# Patient Record
Sex: Female | Born: 1980 | Hispanic: Yes | Marital: Single | Smoking: Never smoker
Health system: Southern US, Community
[De-identification: ages and names within clinical notes are randomized; demographics above are authoritative.]

## PROBLEM LIST (undated history)

## (undated) ENCOUNTER — Inpatient Hospital Stay (HOSPITAL_COMMUNITY): Payer: Self-pay

## (undated) DIAGNOSIS — Z789 Other specified health status: Secondary | ICD-10-CM

## (undated) HISTORY — PX: NO PAST SURGERIES: SHX2092

---

## 2006-02-12 ENCOUNTER — Ambulatory Visit: Payer: Self-pay | Admitting: Obstetrics & Gynecology

## 2006-02-19 ENCOUNTER — Ambulatory Visit: Payer: Self-pay | Admitting: Obstetrics & Gynecology

## 2006-02-22 ENCOUNTER — Ambulatory Visit: Payer: Self-pay | Admitting: Obstetrics & Gynecology

## 2006-02-22 ENCOUNTER — Inpatient Hospital Stay (HOSPITAL_COMMUNITY): Admission: AD | Admit: 2006-02-22 | Discharge: 2006-02-25 | Payer: Self-pay | Admitting: Obstetrics & Gynecology

## 2008-11-10 ENCOUNTER — Emergency Department (HOSPITAL_COMMUNITY): Admission: EM | Admit: 2008-11-10 | Discharge: 2008-11-10 | Payer: Self-pay | Admitting: Emergency Medicine

## 2009-05-01 LAB — SICKLE CELL SCREEN: SICKLE CELL SCREEN: NORMAL

## 2009-06-06 ENCOUNTER — Encounter: Payer: Self-pay | Admitting: Emergency Medicine

## 2009-06-06 ENCOUNTER — Inpatient Hospital Stay (HOSPITAL_COMMUNITY): Admission: AD | Admit: 2009-06-06 | Discharge: 2009-06-06 | Payer: Self-pay | Admitting: Obstetrics & Gynecology

## 2009-06-06 ENCOUNTER — Ambulatory Visit: Payer: Self-pay | Admitting: Obstetrics and Gynecology

## 2010-08-12 LAB — URINALYSIS, ROUTINE W REFLEX MICROSCOPIC
Bilirubin Urine: NEGATIVE
Ketones, ur: NEGATIVE mg/dL
Nitrite: NEGATIVE
Urobilinogen, UA: 0.2 mg/dL (ref 0.0–1.0)
pH: 6 (ref 5.0–8.0)

## 2010-08-12 LAB — CBC
HCT: 37.6 % (ref 36.0–46.0)
Hemoglobin: 13.1 g/dL (ref 12.0–15.0)
WBC: 7.9 10*3/uL (ref 4.0–10.5)

## 2010-08-12 LAB — COMPREHENSIVE METABOLIC PANEL
Alkaline Phosphatase: 52 U/L (ref 39–117)
BUN: 4 mg/dL — ABNORMAL LOW (ref 6–23)
Chloride: 105 mEq/L (ref 96–112)
GFR calc non Af Amer: >60 mL/min (ref >60)
Glucose, Bld: 101 mg/dL — ABNORMAL HIGH (ref 70–99)
Potassium: 3.9 mEq/L (ref 3.5–5.1)
Total Bilirubin: 0.4 mg/dL (ref 0.3–1.2)

## 2010-08-12 LAB — DIFFERENTIAL
Basophils Absolute: 0.1 10*3/uL (ref 0.0–0.1)
Basophils Relative: 1 % (ref 0–1)
Neutro Abs: 4.9 10*3/uL (ref 1.7–7.7)
Neutrophils Relative %: 62 % (ref 43–77)

## 2010-08-12 LAB — URINE MICROSCOPIC-ADD ON

## 2010-08-12 LAB — RPR: RPR Ser Ql: NONREACTIVE

## 2010-08-12 LAB — POCT PREGNANCY, URINE: Preg Test, Ur: POSITIVE

## 2013-09-27 LAB — CYTOLOGY - PAP: PAP SMEAR: NEGATIVE

## 2014-02-09 ENCOUNTER — Encounter (HOSPITAL_COMMUNITY): Payer: Self-pay | Admitting: *Deleted

## 2014-02-09 ENCOUNTER — Inpatient Hospital Stay (HOSPITAL_COMMUNITY): Payer: Self-pay

## 2014-02-09 ENCOUNTER — Telehealth: Payer: Self-pay | Admitting: Obstetrics and Gynecology

## 2014-02-09 ENCOUNTER — Inpatient Hospital Stay (HOSPITAL_COMMUNITY)
Admission: AD | Admit: 2014-02-09 | Discharge: 2014-02-09 | Disposition: A | Discharge status: Home / Self Care | Payer: Self-pay | Source: Ambulatory Visit | Attending: Obstetrics & Gynecology | Admitting: Obstetrics & Gynecology

## 2014-02-09 DIAGNOSIS — N760 Acute vaginitis: Secondary | ICD-10-CM | POA: Insufficient documentation

## 2014-02-09 DIAGNOSIS — R109 Unspecified abdominal pain: Secondary | ICD-10-CM | POA: Insufficient documentation

## 2014-02-09 DIAGNOSIS — O26899 Other specified pregnancy related conditions, unspecified trimester: Secondary | ICD-10-CM

## 2014-02-09 DIAGNOSIS — O2390 Unspecified genitourinary tract infection in pregnancy, unspecified trimester: Secondary | ICD-10-CM | POA: Insufficient documentation

## 2014-02-09 DIAGNOSIS — O468X1 Other antepartum hemorrhage, first trimester: Secondary | ICD-10-CM

## 2014-02-09 DIAGNOSIS — O418X10 Other specified disorders of amniotic fluid and membranes, first trimester, not applicable or unspecified: Secondary | ICD-10-CM

## 2014-02-09 DIAGNOSIS — K219 Gastro-esophageal reflux disease without esophagitis: Secondary | ICD-10-CM | POA: Insufficient documentation

## 2014-02-09 DIAGNOSIS — O208 Other hemorrhage in early pregnancy: Secondary | ICD-10-CM | POA: Insufficient documentation

## 2014-02-09 DIAGNOSIS — A499 Bacterial infection, unspecified: Secondary | ICD-10-CM | POA: Insufficient documentation

## 2014-02-09 DIAGNOSIS — B9689 Other specified bacterial agents as the cause of diseases classified elsewhere: Secondary | ICD-10-CM | POA: Insufficient documentation

## 2014-02-09 HISTORY — DX: Other specified health status: Z78.9

## 2014-02-09 LAB — URINALYSIS, ROUTINE W REFLEX MICROSCOPIC
Bilirubin Urine: NEGATIVE
GLUCOSE, UA: NEGATIVE mg/dL
Hgb urine dipstick: NEGATIVE
Ketones, ur: NEGATIVE mg/dL
Nitrite: NEGATIVE
PROTEIN: NEGATIVE mg/dL
Specific Gravity, Urine: <1.005 — ABNORMAL LOW (ref 1.005–1.030)
Urobilinogen, UA: 0.2 mg/dL (ref 0.0–1.0)
pH: 5.5 (ref 5.0–8.0)

## 2014-02-09 LAB — URINE MICROSCOPIC-ADD ON

## 2014-02-09 LAB — CBC
HEMATOCRIT: 39.6 % (ref 36.0–46.0)
HEMOGLOBIN: 13.3 g/dL (ref 12.0–15.0)
MCH: 29.8 pg (ref 26.0–34.0)
MCHC: 33.6 g/dL (ref 30.0–36.0)
MCV: 88.6 fL (ref 78.0–100.0)
Platelets: 216 10*3/uL (ref 150–400)
RBC: 4.47 MIL/uL (ref 3.87–5.11)
RDW: 13.5 % (ref 11.5–15.5)
WBC: 5.8 10*3/uL (ref 4.0–10.5)

## 2014-02-09 LAB — HCG, QUANTITATIVE, PREGNANCY: HCG, BETA CHAIN, QUANT, S: 22651 m[IU]/mL — AB (ref <5)

## 2014-02-09 LAB — ABO/RH: ABO/RH(D): O POS

## 2014-02-09 LAB — WET PREP, GENITAL
TRICH WET PREP: NONE SEEN
Yeast Wet Prep HPF POC: NONE SEEN

## 2014-02-09 LAB — POCT PREGNANCY, URINE: PREG TEST UR: POSITIVE — AB

## 2014-02-09 LAB — HIV ANTIBODY (ROUTINE TESTING W REFLEX): HIV 1&2 Ab, 4th Generation: NONREACTIVE

## 2014-02-09 MED ORDER — FAMOTIDINE 20 MG PO TABS
20.0000 mg | ORAL_TABLET | Freq: Once | ORAL | Status: AC
  Administered 2014-02-09: 20 mg via ORAL
  Filled 2014-02-09: qty 1

## 2014-02-09 MED ORDER — METRONIDAZOLE 500 MG PO TABS: 500.0000 mg | ORAL_TABLET | Freq: Two times a day (BID) | ORAL | Status: DC

## 2014-02-09 NOTE — MAU Provider Note (Signed)
Attestation of Attending Supervision of Advanced Practitioner (CNM/NP): Evaluation and management procedures were performed by the Advanced Practitioner under my supervision and collaboration. I have reviewed the Advanced Practitioner's note and chart, and I agree with the management and plan.  Catalena Stanhope H. 3:12 PM

## 2014-02-09 NOTE — MAU Provider Note (Signed)
History     CSN: [id number]  Arrival date and time: 02/09/14 0830   First Provider Initiated Contact with Patient 02/09/14 [phone]      Chief Complaint  Patient presents with  . Abdominal Pain   HPI  Destiny Jacobs is a 33 y.o. female [phone] at [redacted]w[redacted]d who presents with upper and lower abdominal pain. The pain started 3 days; the pain comes and goes. She denies vaginal bleeding. Spanish interpretor used at the bedside.   OB History   Grav Para Term Preterm Abortions TAB SAB Ect Mult Living   Past Medical History  Diagnosis Date  . Medical history non-contributory     Past Surgical History  Procedure Laterality Date  . No past surgeries      History reviewed. No pertinent family history.  History  Substance Use Topics  . Smoking status: Never Smoker   . Smokeless tobacco: Not on file  . Alcohol Use: No    Allergies: No Known Allergies  No prescriptions prior to admission   Results for orders placed during the hospital encounter of 02/09/14 (from the past 48 hour(s))  URINALYSIS, ROUTINE W REFLEX MICROSCOPIC     Status: Abnormal   Collection Time    02/09/14  8:40 AM      Result Value Ref Range   Color, Urine YELLOW  YELLOW   APPearance CLEAR  CLEAR   Specific Gravity, Urine <1.005 (*) 1.005 - 1.030   pH 5.5  5.0 - 8.0   Glucose, UA NEGATIVE  NEGATIVE mg/dL   Hgb urine dipstick NEGATIVE  NEGATIVE   Bilirubin Urine NEGATIVE  NEGATIVE   Ketones, ur NEGATIVE  NEGATIVE mg/dL   Protein, ur NEGATIVE  NEGATIVE mg/dL   Urobilinogen, UA 0.2  0.0 - 1.0 mg/dL   Nitrite NEGATIVE  NEGATIVE   Leukocytes, UA SMALL (*) NEGATIVE  URINE MICROSCOPIC-ADD ON     Status: Abnormal   Collection Time    02/09/14  8:40 AM      Result Value Ref Range   Squamous Epithelial / LPF FEW (*) RARE   WBC, UA 3-6  <3 WBC/hpf   RBC / HPF 0-2  <3 RBC/hpf   Bacteria, UA RARE  RARE  POCT PREGNANCY, URINE     Status: Abnormal   Collection Time    02/09/14  9:07 AM        Result Value Ref Range   Preg Test, Ur POSITIVE (*) NEGATIVE   Comment:            THE SENSITIVITY OF THIS     METHODOLOGY IS >24 mIU/mL  WET PREP, GENITAL     Status: Abnormal   Collection Time    02/09/14  9:27 AM      Result Value Ref Range   Yeast Wet Prep HPF POC NONE SEEN  NONE SEEN   Trich, Wet Prep NONE SEEN  NONE SEEN   Clue Cells Wet Prep HPF POC MODERATE (*) NONE SEEN   WBC, Wet Prep HPF POC MODERATE (*) NONE SEEN   Comment: MODERATE BACTERIA SEEN  CBC     Status: None   Collection Time    02/09/14  9:40 AM      Result Value Ref Range   WBC 5.8  4.0 - 10.5 K/uL   RBC 4.47  3.87 - 5.11 MIL/uL   Hemoglobin 13.3  12.0 - 15.0 g/dL  HCT 39.6  36.0 - 46.0 %   MCV 88.6  78.0 - 100.0 fL   MCH 29.8  26.0 - 34.0 pg   MCHC 33.6  30.0 - 36.0 g/dL   RDW 16.1  09.6 - 04.5 %   Platelets 216  150 - 400 K/uL  ABO/RH     Status: None   Collection Time    02/09/14  9:40 AM      Result Value Ref Range   ABO/RH(D) O POS    HCG, QUANTITATIVE, PREGNANCY     Status: Abnormal   Collection Time    02/09/14  9:40 AM      Result Value Ref Range   hCG, Beta Chain, Quant, S [id number] (*) <5 mIU/mL   Comment:              GEST. AGE      CONC.  (mIU/mL)       <=1 WEEK        5 - 50         2 WEEKS       50 - 500         3 WEEKS       100 - 10,000         4 WEEKS     1,000 - 30,000         5 WEEKS     3,500 - 115,000       6-8 WEEKS     12,000 - 270,000        12 WEEKS     15,000 - 220,000                FEMALE AND NON-PREGNANT FEMALE:         LESS THAN 5 mIU/mL   US Ob Comp Less 14 Wks  02/09/2014   CLINICAL DATA:  Pelvic pain  EXAM: OBSTETRIC <14 WK [place] AND TRANSVAGINAL OB US  TECHNIQUE: Both transabdominal and transvaginal ultrasound examinations were performed for complete evaluation of the gestation as well as the maternal uterus, adnexal regions, and pelvic cul-de-sac. Transvaginal technique was performed to assess early pregnancy.  COMPARISON:  None.  FINDINGS: Intrauterine  gestational sac: Visualized/normal in shape.  Yolk sac:  Visualized  Embryo:  Visualized  Cardiac Activity: Visualized  Heart Rate:  102 bpm  CRL:   3  mm   6 w 0 d                  [place] EDC: Oct 05, 2014  Maternal uterus/adnexae: There are 2 small subchorionic hemorrhages. Toward the right, there is a 2.0 x 1.0 x 0.9 cm subchorionic hemorrhage. Toward the left, there is a 0.8 x 0.5 x 0.5 cm subchorionic hemorrhage. Cervical os is closed.  There is a corpus luteum in the right ovary measuring 3.8 x 3.1 x 3.3 cm. A smaller cystic area in the right ovary measures 1.7 x 1.3 x 1.1 cm. There is no other extrauterine pelvic or adnexal mass. There is no free pelvic fluid.  IMPRESSION: Single live intrauterine gestation with estimated gestational age of [redacted] weeks. There are 2 small subchorionic hemorrhages. Corpus luteum right ovary measuring 3.8 x 3.1 x 3.3 cm. A smaller cystic area in the right ovary probably represents a follicle.   Electronically Signed   By: Bretta Bang M.D.   On: 02/09/2014 10:45   US Ob Transvaginal  02/09/2014   CLINICAL DATA:  Pelvic pain  EXAM: OBSTETRIC <14 WK  [place] AND TRANSVAGINAL OB US  TECHNIQUE: Both transabdominal and transvaginal ultrasound examinations were performed for complete evaluation of the gestation as well as the maternal uterus, adnexal regions, and pelvic cul-de-sac. Transvaginal technique was performed to assess early pregnancy.  COMPARISON:  None.  FINDINGS: Intrauterine gestational sac: Visualized/normal in shape.  Yolk sac:  Visualized  Embryo:  Visualized  Cardiac Activity: Visualized  Heart Rate:  102 bpm  CRL:   3  mm   6 w 0 d                  [place] EDC: Oct 05, 2014  Maternal uterus/adnexae: There are 2 small subchorionic hemorrhages. Toward the right, there is a 2.0 x 1.0 x 0.9 cm subchorionic hemorrhage. Toward the left, there is a 0.8 x 0.5 x 0.5 cm subchorionic hemorrhage. Cervical os is closed.  There is a corpus luteum in the right ovary measuring 3.8 x 3.1 x 3.3  cm. A smaller cystic area in the right ovary measures 1.7 x 1.3 x 1.1 cm. There is no other extrauterine pelvic or adnexal mass. There is no free pelvic fluid.  IMPRESSION: Single live intrauterine gestation with estimated gestational age of [redacted] weeks. There are 2 small subchorionic hemorrhages. Corpus luteum right ovary measuring 3.8 x 3.1 x 3.3 cm. A smaller cystic area in the right ovary probably represents a follicle.   Electronically Signed   By: Bretta Bang M.D.   On: 02/09/2014 10:45    Review of Systems  Constitutional: Negative for fever and chills.  Gastrointestinal: Positive for abdominal pain. Negative for nausea and vomiting.  Genitourinary: Negative for dysuria, urgency and frequency.  Neurological: Negative for headaches.   Physical Exam   Blood pressure 115/71, pulse 95, temperature 97.9 F (36.6 C), temperature source Oral, resp. rate 16, last menstrual period 12/26/2013.  Physical Exam  Constitutional: She is oriented to person, place, and time. She appears well-developed and well-nourished. No distress.  HENT:  Head: Normocephalic.  Eyes: Pupils are equal, round, and reactive to light.  Neck: Neck supple.  Respiratory: Effort normal.  GI: Soft. Normal appearance. There is tenderness in the epigastric area. There is no rigidity and no guarding.    Genitourinary:  Speculum exam: Vagina - Small amount of creamy discharge, no odor Cervix - No contact bleeding Bimanual exam: Cervix closed Uterus non tender, Enlarged  Adnexa non tender, no masses bilaterally GC/Chlam, wet prep done Chaperone present for exam.   Musculoskeletal: Normal range of motion.  Neurological: She is alert and oriented to person, place, and time.  Skin: Skin is warm. She is not diaphoretic.  Psychiatric: Her behavior is normal.    MAU Course  Procedures None  MDM UA  Upt + CBC CMP ABO Hcg [place] Wet prep GC HIV  Pepcid Patient rates her upper abdominal pain 0/10 following  Pepcid  Assessment and Plan   A:  IUP at [redacted]w[redacted]d Subchorionic hemorrhage Abdominal pain in pregnancy  Bacterial vaginosis  GERD in pregnancy   P:  Discharge home in stable condition RX: Flagyl Start prenatal care ASAP. HD phone number provided Return to MAU as needed First trimester warning signs discussed  Ok to take Tums or Pepcid over the counter as directed, as needed for GERD symptoms  Iona Hansen Rasch, NP 02/09/2014, 9:16 AM

## 2014-02-09 NOTE — Discharge Instructions (Signed)
Dolor abdominal en el embarazo °(Abdominal Pain During Pregnancy) °El dolor abdominal es frecuente durante el embarazo. Generalmente no causa ningún daño. El dolor abdominal puede tener numerosas causas. Algunas causas son más graves que otras. Ciertas causas de dolor abdominal durante el embarazo se diagnostican fácilmente. A veces, se tarda un tiempo para llegar al diagnóstico. Otras veces la causa no se conoce. El dolor abdominal puede estar relacionado con alguna alteración del embarazo, o puede deberse a una causa totalmente diferente. Por este motivo, siempre consulte a su médico cuando sienta molestias abdominales. °INSTRUCCIONES PARA EL CUIDADO EN EL HOGAR  °Esté atenta al dolor para ver si hay cambios. Las siguientes indicaciones ayudarán a aliviar cualquier molestia que pueda sentir: °· No tenga relaciones sexuales y no coloque nada dentro de la vagina hasta que los síntomas hayan desaparecido completamente. °· Descanse todo lo que pueda hasta que el dolor se le haya calmado. °· Si siente náuseas, beba líquidos claros. Evite los alimentos sólidos mientras sienta malestar o tenga náuseas. °· Tome sólo medicamentos de venta libre o recetados, según las indicaciones del médico. °· Cumpla con todas las visitas de control, según le indique su médico. °SOLICITE ATENCIÓN MÉDICA DE INMEDIATO SI: °· Tiene un sangrado, pérdida de líquidos o elimina tejidos por la vagina. °· El dolor o los cólicos aumentan. °· Tiene vómitos persistentes. °· Comienza a sentir dolor al orinar u observa sangre. °· Tiene fiebre. °· Nota que los movimientos del bebé disminuyen. °· Siente intensa debilidad o se marea. °· Tiene dificultad para respirar con o sin dolor abdominal. °· Siente un dolor de cabeza intenso junto al dolor abdominal. °· Tiene una secreción vaginal anormal con dolor abdominal. °· Tiene diarrea persistente. °· El dolor abdominal sigue o empeora aún después de hacer reposo. °ASEGÚRESE DE QUE:  °· Comprende estas  instrucciones. °· Controlará su afección. °· Recibirá ayuda de inmediato si no mejora o si empeora. °Document Released: 05/13/2005 Document Revised: 03/03/2013 °ExitCare® Patient Information ©2015 [employer]. This information is not intended to replace advice given to you by your health care provider. Make sure you discuss any questions you have with your health care provider. ° °

## 2014-02-09 NOTE — Telephone Encounter (Signed)
Patient notified of wet prep results. +BV. Flagyl sent to her pharmacy.

## 2014-02-09 NOTE — MAU Note (Signed)
Pos HPT test today, C/O upper & mid abd pain.  Pt denies bleeding.  Pt is taking BCP's, only spotted with last period.

## 2014-02-10 LAB — GC/CHLAMYDIA PROBE AMP
CT Probe RNA: NEGATIVE
GC Probe RNA: NEGATIVE

## 2014-03-28 ENCOUNTER — Encounter (HOSPITAL_COMMUNITY): Payer: Self-pay | Admitting: *Deleted

## 2014-03-31 ENCOUNTER — Other Ambulatory Visit (HOSPITAL_COMMUNITY): Payer: Self-pay | Admitting: Urology

## 2014-03-31 DIAGNOSIS — O09291 Supervision of pregnancy with other poor reproductive or obstetric history, first trimester: Secondary | ICD-10-CM

## 2014-03-31 DIAGNOSIS — Z369 Encounter for antenatal screening, unspecified: Secondary | ICD-10-CM

## 2014-03-31 LAB — OB RESULTS CONSOLE HGB/HCT, BLOOD
HEMATOCRIT: 38 %
Hemoglobin: 12.4 g/dL

## 2014-03-31 LAB — CULTURE, OB URINE: Urine Culture, OB: NEGATIVE

## 2014-03-31 LAB — OB RESULTS CONSOLE GC/CHLAMYDIA
CHLAMYDIA, DNA PROBE: NEGATIVE
GC PROBE AMP, GENITAL: NEGATIVE

## 2014-03-31 LAB — OB RESULTS CONSOLE HEPATITIS B SURFACE ANTIGEN: Hepatitis B Surface Ag: NEGATIVE

## 2014-03-31 LAB — DRUG SCREEN, URINE: Drug Screen, Urine: NEGATIVE

## 2014-03-31 LAB — OB RESULTS CONSOLE HIV ANTIBODY (ROUTINE TESTING): HIV: NONREACTIVE

## 2014-03-31 LAB — OB RESULTS CONSOLE RUBELLA ANTIBODY, IGM: Rubella: IMMUNE

## 2014-03-31 LAB — OB RESULTS CONSOLE PLATELET COUNT: PLATELETS: 219 10*3/uL

## 2014-03-31 LAB — OB RESULTS CONSOLE RPR: RPR: NONREACTIVE

## 2014-03-31 LAB — OB RESULTS CONSOLE ANTIBODY SCREEN: Antibody Screen: NEGATIVE

## 2014-03-31 LAB — GLUCOSE TOLERANCE, 1 HOUR (50G) W/O FASTING: Glucose, GTT - 1 Hour: 75 mg/dL (ref ?–200)

## 2014-03-31 LAB — CYSTIC FIBROSIS DIAGNOSTIC STUDY: INTERPRETATION-CFDNA: NEGATIVE

## 2014-03-31 LAB — OB RESULTS CONSOLE ABO/RH: RH TYPE: POSITIVE

## 2014-04-01 ENCOUNTER — Ambulatory Visit (HOSPITAL_COMMUNITY): Payer: Self-pay

## 2014-04-01 ENCOUNTER — Ambulatory Visit (HOSPITAL_COMMUNITY): Payer: Self-pay | Attending: Obstetrics & Gynecology

## 2014-04-19 ENCOUNTER — Encounter: Payer: Self-pay | Admitting: Obstetrics and Gynecology

## 2014-05-03 ENCOUNTER — Encounter: Payer: Self-pay | Admitting: Obstetrics & Gynecology

## 2014-05-03 ENCOUNTER — Ambulatory Visit (INDEPENDENT_AMBULATORY_CARE_PROVIDER_SITE_OTHER): Payer: Self-pay | Admitting: Obstetrics & Gynecology

## 2014-05-03 VITALS — BP 104/61 | HR 82 | Wt 164.6 lb

## 2014-05-03 DIAGNOSIS — O0990 Supervision of high risk pregnancy, unspecified, unspecified trimester: Secondary | ICD-10-CM | POA: Insufficient documentation

## 2014-05-03 DIAGNOSIS — Z8759 Personal history of other complications of pregnancy, childbirth and the puerperium: Secondary | ICD-10-CM

## 2014-05-03 DIAGNOSIS — O0992 Supervision of high risk pregnancy, unspecified, second trimester: Secondary | ICD-10-CM

## 2014-05-03 DIAGNOSIS — Z8742 Personal history of other diseases of the female genital tract: Secondary | ICD-10-CM

## 2014-05-03 LAB — POCT URINALYSIS DIP (DEVICE)
Bilirubin Urine: NEGATIVE
Glucose, UA: NEGATIVE mg/dL
Ketones, ur: NEGATIVE mg/dL
Leukocytes, UA: NEGATIVE
Nitrite: NEGATIVE
PROTEIN: NEGATIVE mg/dL
Specific Gravity, Urine: 1.025 (ref 1.005–1.030)
UROBILINOGEN UA: 0.2 mg/dL (ref 0.0–1.0)
pH: 6 (ref 5.0–8.0)

## 2014-05-03 NOTE — Patient Instructions (Signed)
Segundo trimestre de embarazo (Second Trimester of Pregnancy) El segundo trimestre va desde la semana13 hasta la 28, desde el cuarto hasta el sexto mes, y suele ser el momento en el que mejor se siente. Su organismo se ha adaptado a estar embarazada y comienza a sentirse fsicamente mejor. En general, las nuseas matutinas han disminuido o han desaparecido completamente, p El segundo trimestre es tambin la poca en la que el feto se desarrolla rpidamente. Hacia el final del sexto mes, el feto mide aproximadamente 9pulgadas (23cm) y pesa alrededor de 1 libras (700g). Es probable que sienta que el beb se mueve (da pataditas) entre las 18 y 20semanas del embarazo. CAMBIOS EN EL ORGANISMO Su organismo atraviesa por muchos cambios durante el embarazo, y estos varan de una mujer a otra.   Seguir aumentando de peso. Notar que la parte baja del abdomen sobresale.  Podrn aparecer las primeras estras en las caderas, el abdomen y las mamas.  Es posible que tenga dolores de cabeza que pueden aliviarse con los medicamentos que su mdico autorice.  Tal vez tenga necesidad de orinar con ms frecuencia porque el feto est ejerciendo presin sobre la vejiga.  Debido al embarazo podr sentir acidez estomacal con frecuencia.  Puede estar estreida, ya que ciertas hormonas enlentecen los movimientos de los msculos que empujan los desechos a travs de los intestinos.  Pueden aparecer hemorroides o abultarse e hincharse las venas (venas varicosas).  Puede tener dolor de espalda que se debe al aumento de peso y a que las hormonas del embarazo relajan las articulaciones entre los huesos de la pelvis, y como consecuencia de la modificacin del peso y los msculos que mantienen el equilibrio.  Las mamas seguirn creciendo y le dolern.  Las encas pueden sangrar y estar sensibles al cepillado y al hilo dental.  Pueden aparecer zonas oscuras o manchas (cloasma, mscara del embarazo) en el rostro que  probablemente se atenuarn despus del nacimiento del beb.  Es posible que se forme una lnea oscura desde el ombligo hasta la zona del pubis (linea nigra) que probablemente se atenuarn despus del nacimiento del beb.  Tal vez haya cambios en el cabello que pueden incluir su engrosamiento, crecimiento rpido y cambios en la textura. Adems, a algunas mujeres se les cae el cabello durante o despus del embarazo, o tienen el cabello seco o fino. Lo ms probable es que el cabello se le normalice despus del nacimiento del beb. QU DEBE ESPERAR EN LAS CONSULTAS PRENATALES Durante una visita prenatal de rutina:  La pesarn para asegurarse de que usted y el feto estn creciendo normalmente.  Le tomarn la presin arterial.  Le medirn el abdomen para controlar el desarrollo del beb.  Se escucharn los latidos cardacos fetales.  Se evaluarn los resultados de los estudios solicitados en visitas anteriores. El mdico puede preguntarle lo siguiente:  Cmo se siente.  Si siente los movimientos del beb.  Si ha tenido sntomas anormales, como prdida de lquido, sangrado, dolores de cabeza intensos o clicos abdominales.  Si tiene alguna pregunta. Otros estudios que podrn realizarse durante el segundo trimestre incluyen lo siguiente:  Anlisis de sangre para detectar:  Concentraciones de hierro bajas (anemia).  Diabetes gestacional (entre la semana 24 y la 28).  Anticuerpos Rh.  Anlisis de orina para detectar infecciones, diabetes o protenas en la orina.  Una ecografa para confirmar que el beb crece y se desarrolla correctamente.  Una amniocentesis para diagnosticar posibles problemas genticos.  Estudios del feto para descartar espina   bfida y sndrome de Down. INSTRUCCIONES PARA EL CUIDADO EN EL HOGAR   Evite fumar, consumir hierbas, beber alcohol y tomar frmacos que no le hayan recetado. Estas sustancias qumicas afectan la formacin y el desarrollo del beb.  Siga  las indicaciones del mdico en relacin con el uso de medicamentos. Durante el embarazo, hay medicamentos que son seguros de tomar y otros que no.  Haga actividad fsica solo en la forma indicada por el mdico. Sentir clicos uterinos es un buen signo para detener la actividad fsica.  Contine comiendo alimentos que sanos con regularidad.  Use un sostn que le brinde buen soporte si le duelen las mamas.  No se d baos de inmersin en agua caliente, baos turcos ni saunas.  Colquese el cinturn de seguridad cuando conduzca.  No coma carne cruda ni queso sin cocinar; evite el contacto con las bandejas sanitarias de los gatos y la tierra que estos animales usan. Estos elementos contienen grmenes que pueden causar defectos congnitos en el beb.  Tome las vitaminas prenatales.  Si est estreida, pruebe un laxante suave (si el mdico lo autoriza). Consuma ms alimentos ricos en fibra, como vegetales y frutas frescos y cereales integrales. Beba gran cantidad de lquido para mantener la orina de tono claro o color amarillo plido.  Dese baos de asiento con agua tibia para aliviar el dolor o las molestias causadas por las hemorroides. Use una crema para las hemorroides si el mdico la autoriza.  Si tiene venas varicosas, use medias de descanso. Eleve los pies durante 15minutos, 3 o 4veces por da. Limite la cantidad de sal en su dieta.  No levante objetos pesados, use zapatos de tacones bajos y mantenga una buena postura.  Descanse con las piernas elevadas si tiene calambres o dolor de cintura.  Visite a su dentista si an no lo ha hecho durante el embarazo. Use un cepillo de dientes blando para higienizarse los dientes y psese el hilo dental con suavidad.  Puede seguir manteniendo relaciones sexuales, a menos que el mdico le indique lo contrario.  Concurra a todas las visitas prenatales segn las indicaciones de su mdico. SOLICITE ATENCIN MDICA SI:   Tiene mareos.  Siente  clicos leves, presin en la pelvis o dolor persistente en el abdomen.  Tiene nuseas, vmitos o diarrea persistentes.  Tiene secrecin vaginal con mal olor.  Siente dolor al orinar. SOLICITE ATENCIN MDICA DE INMEDIATO SI:   Tiene fiebre.  Tiene una prdida de lquido por la vagina.  Tiene sangrado o pequeas prdidas vaginales.  Siente dolor intenso o clicos en el abdomen.  Sube o baja de peso rpidamente.  Tiene dificultad para respirar y siente dolor de pecho.  Sbitamente se le hinchan mucho el rostro, las manos, los tobillos, los pies o las piernas.  No ha sentido los movimientos del beb durante una hora.  Siente un dolor de cabeza intenso que no se alivia con medicamentos.  Hay cambios en la visin. Document Released: 02/20/2005 Document Revised: 05/18/2013 ExitCare Patient Information 2015 [employer]. This information is not intended to replace advice given to you by your health care provider. Make sure you discuss any questions you have with your health care provider.  

## 2014-05-03 NOTE — Progress Notes (Signed)
[facility] but we need records. Notes d/c, wet prep sent  Subjective:transfer from [facility] h/o SGA infants    Destiny Jacobs is a [id number] 1733w2d being seen today for her first obstetrical visit.  Her obstetrical history is significant for SGA infants. Patient does intend to breast feed. Pregnancy history fully reviewed.  Patient reports no complaints.  Filed Vitals:   05/03/14 0916  BP: 104/61  Pulse: 82  Weight: 164 lb 9.6 oz (74.662 kg)    HISTORY: OB History  Gravida Para Term Preterm AB SAB TAB Ectopic Multiple Living  5 3 3  1 1    3     # Outcome Date GA Lbr Len/2nd Weight Sex Delivery Anes PTL Lv  5 Current           4 SAB 2014 5079w0d         3 Term 10/05/10 7168w0d  5 lb (2.268 kg)  Vag-Spont     2 Term 02/23/06 3168w0d  4 lb (1.814 kg)  Vag-Spont     1 Term 08/31/02 2668w0d  5 lb (2.268 kg)  Vag-Spont        Past Medical History  Diagnosis Date  . Medical history non-contributory    Past Surgical History  Procedure Laterality Date  . No past surgeries     Family History  Problem Relation Age of Onset  . Diabetes Father      Exam    Uterus:  Fundal Height: 19 cm  Pelvic Exam:    Perineum:    Vulva:    Vagina:     pH:    Cervix:    Adnexa:    Bony Pelvis:   System: Breast:     Skin: normal coloration and turgor, no rashes    Neurologic: oriented, normal mood   Extremities: normal strength, tone, and muscle mass   HEENT neck supple with midline trachea   Mouth/Teeth mucous membranes moist, pharynx normal without lesions   Neck supple   Cardiovascular: regular rate and rhythm   Respiratory:  appears well, vitals normal, no respiratory distress, acyanotic, normal RR   Abdomen: soft, non-tender; bowel sounds normal; no masses,  no organomegaly   Urinary:        Assessment:    Pregnancy: [id number] Patient Active Problem List   Diagnosis Date Noted  . Supervision of high risk pregnancy, antepartum  05/03/2014  . History of prior pregnancy with SGA newborn 05/03/2014        Plan:     Initial labs drawn. Prenatal vitamins. Problem list reviewed and updated. Genetic Screening discussed Quad Screen: ordered.  Ultrasound discussed; fetal survey: ordered.  Follow up in 4 weeks. 50% of 30 min visit spent on counseling and coordination of care.     Destiny Jacobs 05/03/2014

## 2014-05-03 NOTE — Progress Notes (Signed)
Patient transferred here due to low birth weight baby.  New ob packed given to patient.

## 2014-05-04 LAB — WET PREP, GENITAL
Clue Cells Wet Prep HPF POC: NONE SEEN
Trich, Wet Prep: NONE SEEN
WBC, Wet Prep HPF POC: NONE SEEN
Yeast Wet Prep HPF POC: NONE SEEN

## 2014-05-05 LAB — AFP, QUAD SCREEN
AFP: 52.5 ng/mL
Age Alone: 1:337 {titer}
Curr Gest Age: 17.6 wks.days
HCG, Total: 39.4 IU/mL
INH: 191.2 pg/mL
INTERPRETATION-AFP: NEGATIVE
MoM for AFP: 1.27
MoM for INH: 1.21
MoM for hCG: 1.47
OPEN SPINA BIFIDA: NEGATIVE
Tri 18 Scr Risk Est: NEGATIVE
Trisomy 18 (Edward) Syndrome Interp.: 1:47900 {titer}
UE3 MOM: 1.25
UE3 VALUE: 1.48 ng/mL

## 2014-05-10 ENCOUNTER — Other Ambulatory Visit: Payer: Self-pay | Admitting: Obstetrics & Gynecology

## 2014-05-10 ENCOUNTER — Ambulatory Visit (HOSPITAL_COMMUNITY)
Admission: RE | Admit: 2014-05-10 | Discharge: 2014-05-10 | Disposition: A | Discharge status: Home / Self Care | Payer: Medicaid Other | Source: Ambulatory Visit | Attending: Obstetrics & Gynecology | Admitting: Obstetrics & Gynecology

## 2014-05-10 DIAGNOSIS — Z8759 Personal history of other complications of pregnancy, childbirth and the puerperium: Secondary | ICD-10-CM

## 2014-05-10 DIAGNOSIS — Z8742 Personal history of other diseases of the female genital tract: Secondary | ICD-10-CM | POA: Insufficient documentation

## 2014-05-10 DIAGNOSIS — O0992 Supervision of high risk pregnancy, unspecified, second trimester: Secondary | ICD-10-CM | POA: Insufficient documentation

## 2014-05-27 NOTE — L&D Delivery Note (Signed)
Delivery Note Progressed quickly to complete dilation and pushed well to SVD in short time.   At 2:09 AM a viable and healthy female was delivered via Vaginal, Spontaneous Delivery (Presentation: OA with compound hand on posterior arm side ).   Posterior shoulder delivered first, but unable to sweep hand around to totally free the shoulder.  Anterior shoulder delivered next, within 30 seconds.   APGAR: , ; weight  .   Placenta status: Spontaneous and grossly intact with 3 vessel Cord:  with the following complications: .nuchal cord x 1, loose  Anesthesia: Epidural  Episiotomy:  none Lacerations:  Abrasions  Suture Repair: none Est. Blood Loss (mL):  250  Mom to postpartum.  Baby to Couplet care / Skin to Skin.  Wynelle BourgeoisWILLIAMS,Suki Crockett 10/11/2014, 2:25 AM

## 2014-06-01 ENCOUNTER — Ambulatory Visit (INDEPENDENT_AMBULATORY_CARE_PROVIDER_SITE_OTHER): Payer: Medicaid Other | Admitting: Advanced Practice Midwife

## 2014-06-01 VITALS — BP 94/53 | HR 82 | Ht 60.0 in | Wt 166.7 lb

## 2014-06-01 DIAGNOSIS — O0992 Supervision of high risk pregnancy, unspecified, second trimester: Secondary | ICD-10-CM

## 2014-06-01 LAB — POCT URINALYSIS DIP (DEVICE)
Bilirubin Urine: NEGATIVE
Glucose, UA: NEGATIVE mg/dL
HGB URINE DIPSTICK: NEGATIVE
Ketones, ur: NEGATIVE mg/dL
LEUKOCYTES UA: NEGATIVE
Nitrite: NEGATIVE
PH: 6 (ref 5.0–8.0)
Protein, ur: NEGATIVE mg/dL
SPECIFIC GRAVITY, URINE: 1.01 (ref 1.005–1.030)
Urobilinogen, UA: 0.2 mg/dL (ref 0.0–1.0)

## 2014-06-01 NOTE — Progress Notes (Signed)
Reviewed US and quad. Doing well.

## 2014-06-01 NOTE — Patient Instructions (Signed)
Vacuna contra el ttanos, la difteria y la tos ferina (Tdap): lo que debe saber (Tdap Vaccine (Tetanus, Diphtheria, Pertussis): What You Need to Know) 1. Por qu vacunarse? El ttanos, la difteria y la tos ferina pueden ser enfermedades muy graves, incluso para los adolescentes y los adultos. La vacuna Tdap nos puede proteger de estas enfermedades. El TTANOS (trismo) provoca entumecimiento y contraccin dolorosa de los msculos, por lo general, en todo el cuerpo.  Puede causar el endurecimiento de los msculos de la cabeza y el cuello, de modo que impide abrir la boca, tragar y en algunos casos, respirar. El ttanos causa la muerte de 1 de cada 5 personas que se infectan. La DIFTERIA puede hacer que se forme una membrana gruesa en el fondo de la garganta.  Puede causar problemas respiratorios, parlisis, insuficiencia cardaca e incluso la muerte. La TOS FERINA causa episodios de tos graves, que pueden hacer difcil la respiracin y causar vmitos y trastornos del sueo.  Tambin puede ser la causa de prdida de peso, incontinencia y fractura de costillas. Dos de cada 100 adolescentes y 5 de cada 100 adultos que se enferman de tos ferina deben ser hospitalizados o tienen complicaciones, que podran incluir neumona y muerte. Estas enfermedades son provocadas por bacterias. La difteria y el pertusis se contagian de persona a persona a travs de la tos o el estornudo. El ttanos ingresa al organismo a travs de cortes, rasguos o heridas. Antes de las vacunas, en los [place] se vieron ms de 200000 casos al ao de difteria y tos ferina y cientos de casos de ttanos. Desde el inicio de la vacunacin, los casos de ttanos y difteria han disminuido alrededor del 99% y los casos de tos ferina alrededor del 80%. 2. Vacuna Tdap La vacuna Tdap protege a adolescentes y adultos contra el ttanos, la difteria y la tos ferina. Una dosis de Tdap se administra a los 11 o 12 aos. Las personas que no  recibieron la vacuna Tdap a esa edad deben recibirla tan pronto como sea posible. Es muy importante que los mdicos y todos aquellos que tengan contacto cercano con bebs menores de 12 meses reciban la Tdap. Las mujeres deben recibir una dosis de Tdap en cada embarazo, para proteger al recin nacido de la tos ferina. Los nios tienen mayor riesgo de complicaciones graves y potencialmente mortales debido a la tos ferina. Una vacuna similar, llamada Td, protege contra el ttanos y la difteria, pero no contra la tos ferina. Cada 10 aos debe recibirse un refuerzo de Td. La Tdap se puede administrar como uno de estos refuerzos, si todava no ha recibido una dosis. Tambin se puede aplicar despus de un corte o quemadura grave para prevenir la infeccin por ttanos. El mdico le dar ms informacin. La Tdap puede administrarse de manera segura simultneamente con otras vacunas. 3. Algunas personas no deben recibir esta vacuna  Si alguna vez tuvo una reaccin alrgica potencialmente mortal despus de una dosis de la vacuna contra el ttanos, la diferia o la tos ferina, O tuvo una alergia grave a cualquiera de los componentes de esta vacuna, no debe aplicarse la vacuna. Informe a su mdico si usted sufre algn tipo de alergia grave.  Si estuvo en coma o sufri mltiples convulsiones dentro de los 7 das posteriores despus de una dosis de DTP o DTaP, no debe recibir la Tdap, salvo que se encuentre otra causa. En este caso puede recibir la Td.  Consulte con su mdico si:  tiene   epilepsia u otra enfermedad del sistema nervioso,  siente dolor intenso o se hincha despus de recibir cualquier vacuna contra la difteria, el ttanos o la tos ferina,  alguna vez ha sufrido el sndrome de Guillain-Barr,  no se siente bien el da en que se ha programado la vacuna. 4. Riesgos de una reaccin a la vacuna Con cualquier medicamento, incluyendo las vacunas, existe la posibilidad de que aparezcan efectos  secundarios. Estos son leves y desaparecen por s solos, pero tambin son posibles las reacciones graves. Despus de una vacuna, es posible tener desmayos breves, que causen lesiones por la cada. Sentarse o recostarse durante 15 minutos puede ayudar a evitarlo. Informe al mdico si se siente mareado o aturdido, tiene cambios en la visin o zumbidos en los odos. Problemas leves despus de la vacuna Tdap (que no interfirieron en otras actividades)  Dolor en el sitio de la inyeccin (alrededor de 1 de cada 4 adolescentes o 2 de cada 3 adultos).  Enrojecimiento o hinchazn en el lugar de la inyeccin (alrededor de 1 de cada 5 personas)  Fiebre leve de al menos 100,4F (38C) (hasta alrededor de 1 cada 25 adolescentes o 1 de cada 100 adultos)  Dolor de cabeza (alrededor de 3 o 4 de cada 10 personas )  Cansancio (alrededor de 1 de cada 3 o 4 personas)  Nuseas, vmitos, diarrea, dolor de estmago (hasta 1 de cada 4 adolescentes o 1 de cada 10 adultos)  Escalofros, dolores corporales, dolores articulares, erupciones, inflamacin de las glndulas (poco frecuente). Problemas moderados despus de la vacuna Tdap (que interfirieron en las actividades, pero no exigieron atencin mdica)  Dolor en el lugar de la inyeccin (alrededor de 1 de cada 5 adolescentes o 1 de cada 100 adultos)  Enrojecimiento o inflamacin (hasta 1 de cada 16 adolescentes y 1 de cada 25 adultos)  Fiebre de ms de 102F (38,8C) (alrededor de 1 de cada 100 adolescentes o 1 de cada 250 adultos)  Dolor de cabeza (alrededor de 3 de cada 20 adolescentes y 1 de cada 10 adultos)  Nuseas, vmitos, diarrea, dolor de estmago (hasta 1 o 3 de cada 100 personas)  Hinchazn de todo el brazo en el que se aplic la vacuna (hasta alrededor de 3 de cada 100 personas). Problemas graves despus de la vacuna Tdap (que impidieron realizar las actividades habituales; exigieron atencin mdica)  Inflamacin, dolor intenso, sangrado y  enrojecimiento en el brazo, en el sitio de la inyeccin (poco frecuente). Despus de la administracin de cualquier vacuna, puede ocurrir una reaccin alrgica grave (se calcula que menos de 1 en un milln de dosis). 5. Qu pasa si hay una reaccin grave? A qu signos debo estar atento?  Observe todo lo que le preocupe, como signos de una reaccin alrgica grave, fiebre muy alta o cambios en el comportamiento. Los signos de una reaccin alrgica grave pueden incluir ronchas, hinchazn de la cara y la garganta, dificultad para respirar, latidos cardacos acelerados, mareos y debilidad. Pueden comenzar entre unos pocos minutos y algunas horas despus de la vacunacin. Qu debo hacer?  Si usted piensa que se trata de una reaccin alrgica grave o de otra emergencia que no puede esperar, llame al 911 o lleve a la persona al hospital ms cercano. Sino, llame a su mdico.  Despus, la reaccin debe informarse al Sistema de Informacin sobre Efectos Adversos de las Vacunas (Vaccine Adverse Event Reporting System,VAERS). Su mdico puede presentar este informe, o puede hacerlo usted mismo a travs del sitio web   de VAERS, en www.vaers.hhs.gov, o llamando al 1-800-822-7967. El VAERS es solo para informar reacciones. No brindan consejo mdico. 6. Programa Nacional de Compensacin de Daos por Vacunas El Programa Nacional de Compensacin de Daos por Vacunas (National Vaccine Injury Compensation Program, VICP) es un programa federal que fue creado para compensar a las personas que puedan haber sufrido daos al recibir ciertas vacunas. Aquellas personas que consideren que han sufrido un dao como consecuencia de una vacuna y quieren saber ms acerca del programa y cmo presentar una denuncia, pueden llamar al 1-800-338-2382 o visitar su sitio web en www.hrsa.gov/vaccinecompensation. 7. Cmo puedo obtener ms informacin?  Consulte a su mdico.  Comunquese con el servicio de salud de su localidad o su  estado.  Comunquese con los Centros para el Control y la Prevencin de Enfermedades (Centers for Disease Control and Prevention , CDC).  Llame al 1-800-232-4636 o visite el sitio web de los CDC en www.cdc.gov/vaccines. Declaracin de informacin sobre la vacuna contra la difteria, el ttanos y la tos ferina (Tdap) de los CDC (10/03/11) Document Released: 04/29/2012 Document Revised: 09/27/2013 ExitCare Patient Information 2015 [employer]. This information is not intended to replace advice given to you by your health care provider. Make sure you discuss any questions you have with your health care provider.  

## 2014-06-01 NOTE — Progress Notes (Signed)
Needs refill on PNV. 

## 2014-06-29 ENCOUNTER — Ambulatory Visit (INDEPENDENT_AMBULATORY_CARE_PROVIDER_SITE_OTHER): Payer: Medicaid Other | Admitting: Physician Assistant

## 2014-06-29 VITALS — BP 101/62 | HR 89 | Wt 167.4 lb

## 2014-06-29 DIAGNOSIS — O0990 Supervision of high risk pregnancy, unspecified, unspecified trimester: Secondary | ICD-10-CM

## 2014-06-29 LAB — POCT URINALYSIS DIP (DEVICE)
Bilirubin Urine: NEGATIVE
GLUCOSE, UA: NEGATIVE mg/dL
Hgb urine dipstick: NEGATIVE
KETONES UR: NEGATIVE mg/dL
Leukocytes, UA: NEGATIVE
Nitrite: NEGATIVE
Protein, ur: NEGATIVE mg/dL
Specific Gravity, Urine: 1.015 (ref 1.005–1.030)
Urobilinogen, UA: 0.2 mg/dL (ref 0.0–1.0)
pH: 7 (ref 5.0–8.0)

## 2014-06-29 NOTE — Patient Instructions (Signed)
Segundo trimestre de embarazo (Second Trimester of Pregnancy) El segundo trimestre va desde la semana13 hasta la 28, desde el cuarto hasta el sexto mes, y suele ser el momento en el que mejor se siente. Su organismo se ha adaptado a estar embarazada y comienza a sentirse fsicamente mejor. En general, las nuseas matutinas han disminuido o han desaparecido completamente, p El segundo trimestre es tambin la poca en la que el feto se desarrolla rpidamente. Hacia el final del sexto mes, el feto mide aproximadamente 9pulgadas (23cm) y pesa alrededor de 1 libras (700g). Es probable que sienta que el beb se mueve (da pataditas) entre las 18 y 20semanas del embarazo. CAMBIOS EN EL ORGANISMO Su organismo atraviesa por muchos cambios durante el embarazo, y estos varan de una mujer a otra.   Seguir aumentando de peso. Notar que la parte baja del abdomen sobresale.  Podrn aparecer las primeras estras en las caderas, el abdomen y las mamas.  Es posible que tenga dolores de cabeza que pueden aliviarse con los medicamentos que su mdico autorice.  Tal vez tenga necesidad de orinar con ms frecuencia porque el feto est ejerciendo presin sobre la vejiga.  Debido al embarazo podr sentir acidez estomacal con frecuencia.  Puede estar estreida, ya que ciertas hormonas enlentecen los movimientos de los msculos que empujan los desechos a travs de los intestinos.  Pueden aparecer hemorroides o abultarse e hincharse las venas (venas varicosas).  Puede tener dolor de espalda que se debe al aumento de peso y a que las hormonas del embarazo relajan las articulaciones entre los huesos de la pelvis, y como consecuencia de la modificacin del peso y los msculos que mantienen el equilibrio.  Las mamas seguirn creciendo y le dolern.  Las encas pueden sangrar y estar sensibles al cepillado y al hilo dental.  Pueden aparecer zonas oscuras o manchas (cloasma, mscara del embarazo) en el rostro que  probablemente se atenuarn despus del nacimiento del beb.  Es posible que se forme una lnea oscura desde el ombligo hasta la zona del pubis (linea nigra) que probablemente se atenuarn despus del nacimiento del beb.  Tal vez haya cambios en el cabello que pueden incluir su engrosamiento, crecimiento rpido y cambios en la textura. Adems, a algunas mujeres se les cae el cabello durante o despus del embarazo, o tienen el cabello seco o fino. Lo ms probable es que el cabello se le normalice despus del nacimiento del beb. QU DEBE ESPERAR EN LAS CONSULTAS PRENATALES Durante una visita prenatal de rutina:  La pesarn para asegurarse de que usted y el feto estn creciendo normalmente.  Le tomarn la presin arterial.  Le medirn el abdomen para controlar el desarrollo del beb.  Se escucharn los latidos cardacos fetales.  Se evaluarn los resultados de los estudios solicitados en visitas anteriores. El mdico puede preguntarle lo siguiente:  Cmo se siente.  Si siente los movimientos del beb.  Si ha tenido sntomas anormales, como prdida de lquido, sangrado, dolores de cabeza intensos o clicos abdominales.  Si tiene alguna pregunta. Otros estudios que podrn realizarse durante el segundo trimestre incluyen lo siguiente:  Anlisis de sangre para detectar:  Concentraciones de hierro bajas (anemia).  Diabetes gestacional (entre la semana 24 y la 28).  Anticuerpos Rh.  Anlisis de orina para detectar infecciones, diabetes o protenas en la orina.  Una ecografa para confirmar que el beb crece y se desarrolla correctamente.  Una amniocentesis para diagnosticar posibles problemas genticos.  Estudios del feto para descartar espina   bfida y sndrome de Down. INSTRUCCIONES PARA EL CUIDADO EN EL HOGAR   Evite fumar, consumir hierbas, beber alcohol y tomar frmacos que no le hayan recetado. Estas sustancias qumicas afectan la formacin y el desarrollo del beb.  Siga  las indicaciones del mdico en relacin con el uso de medicamentos. Durante el embarazo, hay medicamentos que son seguros de tomar y otros que no.  Haga actividad fsica solo en la forma indicada por el mdico. Sentir clicos uterinos es un buen signo para detener la actividad fsica.  Contine comiendo alimentos que sanos con regularidad.  Use un sostn que le brinde buen soporte si le duelen las mamas.  No se d baos de inmersin en agua caliente, baos turcos ni saunas.  Colquese el cinturn de seguridad cuando conduzca.  No coma carne cruda ni queso sin cocinar; evite el contacto con las bandejas sanitarias de los gatos y la tierra que estos animales usan. Estos elementos contienen grmenes que pueden causar defectos congnitos en el beb.  Tome las vitaminas prenatales.  Si est estreida, pruebe un laxante suave (si el mdico lo autoriza). Consuma ms alimentos ricos en fibra, como vegetales y frutas frescos y cereales integrales. Beba gran cantidad de lquido para mantener la orina de tono claro o color amarillo plido.  Dese baos de asiento con agua tibia para aliviar el dolor o las molestias causadas por las hemorroides. Use una crema para las hemorroides si el mdico la autoriza.  Si tiene venas varicosas, use medias de descanso. Eleve los pies durante 15minutos, 3 o 4veces por da. Limite la cantidad de sal en su dieta.  No levante objetos pesados, use zapatos de tacones bajos y mantenga una buena postura.  Descanse con las piernas elevadas si tiene calambres o dolor de cintura.  Visite a su dentista si an no lo ha hecho durante el embarazo. Use un cepillo de dientes blando para higienizarse los dientes y psese el hilo dental con suavidad.  Puede seguir manteniendo relaciones sexuales, a menos que el mdico le indique lo contrario.  Concurra a todas las visitas prenatales segn las indicaciones de su mdico. SOLICITE ATENCIN MDICA SI:   Tiene mareos.  Siente  clicos leves, presin en la pelvis o dolor persistente en el abdomen.  Tiene nuseas, vmitos o diarrea persistentes.  Tiene secrecin vaginal con mal olor.  Siente dolor al orinar. SOLICITE ATENCIN MDICA DE INMEDIATO SI:   Tiene fiebre.  Tiene una prdida de lquido por la vagina.  Tiene sangrado o pequeas prdidas vaginales.  Siente dolor intenso o clicos en el abdomen.  Sube o baja de peso rpidamente.  Tiene dificultad para respirar y siente dolor de pecho.  Sbitamente se le hinchan mucho el rostro, las manos, los tobillos, los pies o las piernas.  No ha sentido los movimientos del beb durante una hora.  Siente un dolor de cabeza intenso que no se alivia con medicamentos.  Hay cambios en la visin. Document Released: 02/20/2005 Document Revised: 05/18/2013 ExitCare Patient Information 2015 [employer]. This information is not intended to replace advice given to you by your health care provider. Make sure you discuss any questions you have with your health care provider.  

## 2014-06-29 NOTE — Progress Notes (Signed)
Needs 28 week labs at next visit.

## 2014-06-29 NOTE — Progress Notes (Signed)
26 weeks without complaint.  H/O LBW babies x 3.  Pt has had appropriate fundal measurements and weight gain.  Denies LOF, vag bleeding, dysuria.  Endorses good fealm movement.   PNV qd Monitor weight and fundal height.  RTC 2 weeks

## 2014-07-13 ENCOUNTER — Encounter: Payer: Self-pay | Admitting: Physician Assistant

## 2014-07-13 ENCOUNTER — Ambulatory Visit (INDEPENDENT_AMBULATORY_CARE_PROVIDER_SITE_OTHER): Payer: Medicaid Other | Admitting: Physician Assistant

## 2014-07-13 VITALS — BP 98/64 | HR 83 | Temp 98.3°F | Wt 168.7 lb

## 2014-07-13 DIAGNOSIS — Z23 Encounter for immunization: Secondary | ICD-10-CM

## 2014-07-13 DIAGNOSIS — Z3493 Encounter for supervision of normal pregnancy, unspecified, third trimester: Secondary | ICD-10-CM

## 2014-07-13 LAB — POCT URINALYSIS DIP (DEVICE)
Bilirubin Urine: NEGATIVE
GLUCOSE, UA: NEGATIVE mg/dL
Hgb urine dipstick: NEGATIVE
KETONES UR: NEGATIVE mg/dL
Leukocytes, UA: NEGATIVE
Nitrite: NEGATIVE
PROTEIN: NEGATIVE mg/dL
SPECIFIC GRAVITY, URINE: 1.015 (ref 1.005–1.030)
UROBILINOGEN UA: 0.2 mg/dL (ref 0.0–1.0)
pH: 7 (ref 5.0–8.0)

## 2014-07-13 LAB — CBC
HCT: 34.2 % — ABNORMAL LOW (ref 36.0–46.0)
Hemoglobin: 11.1 g/dL — ABNORMAL LOW (ref 12.0–15.0)
MCH: 28.1 pg (ref 26.0–34.0)
MCHC: 32.5 g/dL (ref 30.0–36.0)
MCV: 86.6 fL (ref 78.0–100.0)
MPV: 10 fL (ref 8.6–12.4)
Platelets: 321 10*3/uL (ref 150–400)
RBC: 3.95 MIL/uL (ref 3.87–5.11)
RDW: 13.2 % (ref 11.5–15.5)
WBC: 7.7 10*3/uL (ref 4.0–10.5)

## 2014-07-13 MED ORDER — TETANUS-DIPHTH-ACELL PERTUSSIS 5-2.5-18.5 LF-MCG/0.5 IM SUSP
0.5000 mL | Freq: Once | INTRAMUSCULAR | Status: AC
  Administered 2014-07-13: 0.5 mL via INTRAMUSCULAR

## 2014-07-13 NOTE — Patient Instructions (Signed)
Tercer trimestre de embarazo (Third Trimester of Pregnancy) El tercer trimestre va desde la semana29 hasta la 42, desde el sptimo hasta el noveno mes, y es la poca en la que el feto crece ms rpidamente. Hacia el final del noveno mes, el feto mide alrededor de 20pulgadas (45cm) de largo y pesa entre 6 y 10 libras (2,700 y 4,500kg).  CAMBIOS EN EL ORGANISMO Su organismo atraviesa por muchos cambios durante el embarazo, y estos varan de una mujer a otra.   Seguir aumentando de peso. Es de esperar que aumente entre 25 y 35libras (11 y 16kg) hacia el final del embarazo.  Podrn aparecer las primeras estras en las caderas, el abdomen y las mamas.  Puede tener necesidad de orinar con ms frecuencia porque el feto baja hacia la pelvis y ejerce presin sobre la vejiga.  Debido al embarazo podr sentir acidez estomacal con frecuencia.  Puede estar estreida, ya que ciertas hormonas enlentecen los movimientos de los msculos que empujan los desechos a travs de los intestinos.  Pueden aparecer hemorroides o abultarse e hincharse las venas (venas varicosas).  Puede sentir dolor plvico debido al aumento de peso y a que las hormonas del embarazo relajan las articulaciones entre los huesos de la pelvis. El dolor de espalda puede ser consecuencia de la sobrecarga de los msculos que soportan la postura.  Tal vez haya cambios en el cabello que pueden incluir su engrosamiento, crecimiento rpido y cambios en la textura. Adems, a algunas mujeres se les cae el cabello durante o despus del embarazo, o tienen el cabello seco o fino. Lo ms probable es que el cabello se le normalice despus del nacimiento del beb.  Las mamas seguirn creciendo y le dolern. A veces, puede haber una secrecin amarilla de las mamas llamada calostro.  El ombligo puede salir hacia afuera.  Puede sentir que le falta el aire debido a que se expande el tero.  Puede notar que el feto "baja" o lo siente ms bajo, en el  abdomen.  Puede tener una prdida de secrecin mucosa con sangre. Esto suele ocurrir en el trmino de unos pocos das a una semana antes de que comience el trabajo de parto.  El cuello del tero se vuelve delgado y blando (se borra) cerca de la fecha de parto. QU DEBE ESPERAR EN LOS EXMENES PRENATALES  Le harn exmenes prenatales cada 2semanas hasta la semana36. A partir de ese momento le harn exmenes semanales. Durante una visita prenatal de rutina:  La pesarn para asegurarse de que usted y el feto estn creciendo normalmente.  Le tomarn la presin arterial.  Le medirn el abdomen para controlar el desarrollo del beb.  Se escucharn los latidos cardacos fetales.  Se evaluarn los resultados de los estudios solicitados en visitas anteriores.  Le revisarn el cuello del tero cuando est prxima la fecha de parto para controlar si este se ha borrado. Alrededor de la semana36, el mdico le revisar el cuello del tero. Al mismo tiempo, realizar un anlisis de las secreciones del tejido vaginal. Este examen es para determinar si hay un tipo de bacteria, estreptococo Grupo B. El mdico le explicar esto con ms detalle. El mdico puede preguntarle lo siguiente:  Cmo le gustara que fuera el parto.  Cmo se siente.  Si siente los movimientos del beb.  Si ha tenido sntomas anormales, como prdida de lquido, sangrado, dolores de cabeza intensos o clicos abdominales.  Si tiene alguna pregunta. Otros exmenes o estudios de deteccin que pueden realizarse   durante el tercer trimestre incluyen lo siguiente:  Anlisis de sangre para controlar las concentraciones de hierro (anemia).  Controles fetales para determinar su salud, nivel de actividad y crecimiento. Si tiene alguna enfermedad o hay problemas durante el embarazo, le harn estudios. FALSO TRABAJO DE PARTO Es posible que sienta contracciones leves e irregulares que finalmente desaparecen. Se llaman contracciones de  Braxton Hicks o falso trabajo de parto. Las contracciones pueden durar horas, das o incluso semanas, antes de que el verdadero trabajo de parto se inicie. Si las contracciones ocurren a intervalos regulares, se intensifican o se hacen dolorosas, lo mejor es que la revise el mdico.  SIGNOS DE TRABAJO DE PARTO   Clicos de tipo menstrual.  Contracciones cada 5minutos o menos.  Contracciones que comienzan en la parte superior del tero y se extienden hacia abajo, a la zona inferior del abdomen y la espalda.  Sensacin de mayor presin en la pelvis o dolor de espalda.  Una secrecin de mucosidad acuosa o con sangre que sale de la vagina. Si tiene alguno de estos signos antes de la semana37 del embarazo, llame a su mdico de inmediato. Debe concurrir al hospital para que la controlen inmediatamente. INSTRUCCIONES PARA EL CUIDADO EN EL HOGAR   Evite fumar, consumir hierbas, beber alcohol y tomar frmacos que no le hayan recetado. Estas sustancias qumicas afectan la formacin y el desarrollo del beb.  Siga las indicaciones del mdico en relacin con el uso de medicamentos. Durante el embarazo, hay medicamentos que son seguros de tomar y otros que no.  Haga actividad fsica solo en la forma indicada por el mdico. Sentir clicos uterinos es un buen signo para detener la actividad fsica.  Contine comiendo alimentos que sanos con regularidad.  Use un sostn que le brinde buen soporte si le duelen las mamas.  No se d baos de inmersin en agua caliente, baos turcos ni saunas.  Colquese el cinturn de seguridad cuando conduzca.  No coma carne cruda ni queso sin cocinar; evite el contacto con las bandejas sanitarias de los gatos y la tierra que estos animales usan. Estos elementos contienen grmenes que pueden causar defectos congnitos en el beb.  Tome las vitaminas prenatales.  Si est estreida, pruebe un laxante suave (si el mdico lo autoriza). Consuma ms alimentos ricos en  fibra, como vegetales y frutas frescos y cereales integrales. Beba gran cantidad de lquido para mantener la orina de tono claro o color amarillo plido.  Dese baos de asiento con agua tibia para aliviar el dolor o las molestias causadas por las hemorroides. Use una crema para las hemorroides si el mdico la autoriza.  Si tiene venas varicosas, use medias de descanso. Eleve los pies durante 15minutos, 3 o 4veces por da. Limite la cantidad de sal en su dieta.  Evite levantar objetos pesados, use zapatos de tacones bajos y mantenga una buena postura.  Descanse con las piernas elevadas si tiene calambres o dolor de cintura.  Visite a su dentista si no lo ha hecho durante el embarazo. Use un cepillo de dientes blando para higienizarse los dientes y psese el hilo dental con suavidad.  Puede seguir manteniendo relaciones sexuales, a menos que el mdico le indique lo contrario.  No haga viajes largos excepto que sea absolutamente necesario y solo con la autorizacin del mdico.  Tome clases prenatales para entender, practicar y hacer preguntas sobre el trabajo de parto y el parto.  Haga un ensayo de la partida al hospital.  Prepare el bolso que   llevar al hospital.  Prepare la habitacin del beb.  Concurra a todas las visitas prenatales segn las indicaciones de su mdico. SOLICITE ATENCIN MDICA SI:  No est segura de que est en trabajo de parto o de que ha roto la bolsa de las aguas.  Tiene mareos.  Siente clicos leves, presin en la pelvis o dolor persistente en el abdomen.  Tiene nuseas, vmitos o diarrea persistentes.  Tiene secrecin vaginal con mal olor.  Siente dolor al orinar. SOLICITE ATENCIN MDICA DE INMEDIATO SI:   Tiene fiebre.  Tiene una prdida de lquido por la vagina.  Tiene sangrado o pequeas prdidas vaginales.  Siente dolor intenso o clicos en el abdomen.  Sube o baja de peso rpidamente.  Tiene dificultad para respirar y siente dolor de  pecho.  Sbitamente se le hinchan mucho el rostro, las manos, los tobillos, los pies o las piernas.  No ha sentido los movimientos del beb durante una hora.  Siente un dolor de cabeza intenso que no se alivia con medicamentos.  Hay cambios en la visin. Document Released: 02/20/2005 Document Revised: 05/18/2013 ExitCare Patient Information 2015 [employer]. This information is not intended to replace advice given to you by your health care provider. Make sure you discuss any questions you have with your health care provider.  

## 2014-07-13 NOTE — Progress Notes (Signed)
28 weeks complaining of left sided lower abd pain that is intermittent, feels like stretching.  Denies N/V, trouble eating, dysuria, LOF, vaginal bleeding.  Endorses good fetal movement.  Has had 13# weight gain this preg.   28 week labs, Tdap today RTC 2 weeks

## 2014-07-13 NOTE — Progress Notes (Signed)
1hr gtt and 28 week labs today.  Destiny Jacobs used as interpreter. Tdap vaccine today.

## 2014-07-14 LAB — GLUCOSE TOLERANCE, 1 HOUR (50G) W/O FASTING: GLUCOSE 1 HOUR GTT: 123 mg/dL (ref 70–140)

## 2014-07-14 LAB — RPR

## 2014-07-14 LAB — HIV ANTIBODY (ROUTINE TESTING W REFLEX): HIV 1&2 Ab, 4th Generation: NONREACTIVE

## 2014-07-27 ENCOUNTER — Ambulatory Visit (INDEPENDENT_AMBULATORY_CARE_PROVIDER_SITE_OTHER): Payer: Self-pay | Admitting: Physician Assistant

## 2014-07-27 VITALS — BP 101/62 | HR 92 | Wt 170.6 lb

## 2014-07-27 DIAGNOSIS — Z3483 Encounter for supervision of other normal pregnancy, third trimester: Secondary | ICD-10-CM

## 2014-07-27 LAB — POCT URINALYSIS DIP (DEVICE)
Bilirubin Urine: NEGATIVE
Glucose, UA: NEGATIVE mg/dL
Hgb urine dipstick: NEGATIVE
Ketones, ur: NEGATIVE mg/dL
Leukocytes, UA: NEGATIVE
NITRITE: NEGATIVE
Protein, ur: NEGATIVE mg/dL
SPECIFIC GRAVITY, URINE: 1.015 (ref 1.005–1.030)
UROBILINOGEN UA: 0.2 mg/dL (ref 0.0–1.0)
pH: 7 (ref 5.0–8.0)

## 2014-07-27 NOTE — Progress Notes (Signed)
Interpreter - Destiny PelSylvia Jacobs present for encounter. Pt reports some abdominal pain in the evening,  ? UC's.

## 2014-07-27 NOTE — Patient Instructions (Signed)
Contracciones de Braxton Hicks °(Braxton Hicks Contractions) °Durante el embarazo, pueden presentarse contracciones uterinas que no siempre indican que está en trabajo de parto.  °¿QUÉ SON LAS CONTRACCIONES DE BRAXTON HICKS?  °Las contracciones que se presentan antes del trabajo de parto se conocen como contracciones de Braxton Hicks o falso trabajo de parto. Hacia el final del embarazo (32 a 34 semanas), estas contracciones pueden aparecen con más frecuencia y volverse más intensas. No corresponden al trabajo de parto verdadero porque estas contracciones no producen el agrandamiento (la dilatación) y el afinamiento del cuello del útero. Algunas veces, es difícil distinguirlas del trabajo de parto verdadero porque en algunos casos pueden ser muy intensas, y las personas tienen diferentes niveles de tolerancia al dolor. No debe sentirse avergonzada si concurre al hospital con falso trabajo de parto. En ocasiones, la única forma de saber si el trabajo de parto es verdadero es que el médico determine si hay cambios en el cuello del útero. °Si no hay problemas prenatales u otras complicaciones de salud asociadas con el embarazo, no habrá inconvenientes si la envían a su casa con falso trabajo de parto y espera que comience el verdadero. °CÓMO DIFERENCIAR EL TRABAJO DE PARTO FALSO DEL VERDADERO °Falso trabajo de parto °· Las contracciones del falso trabajo de parto duran menos y no son tan intensas como las verdaderas. °· Generalmente son irregulares. °· A menudo, se sienten en la parte delantera de la parte baja del abdomen y en la ingle, °· y pueden desaparecer cuando camina o cambia de posición mientras está acostada. °· Las contracciones se vuelven más débiles y su duración es menor a medida que el tiempo transcurre. °· Por lo general, no se hacen progresivamente más intensas, regulares y cercanas entre sí como en el caso del trabajo de parto verdadero. °Verdadero trabajo de parto °· Las contracciones del verdadero  trabajo de parto duran de 30 a 70 segundos, son muy regulares y suelen volverse más intensas, y aumenta su frecuencia. °· No desaparecen cuando camina. °· La molestia generalmente se siente en la parte superior del útero y se extiende hacia la zona inferior del abdomen y hacia la cintura. °· El médico podrá examinarla para determinar si el trabajo de parto es verdadero. El examen mostrará si el cuello del útero se está dilatando y afinando. °LO QUE DEBE RECORDAR °· Continúe haciendo los ejercicios habituales y siga otras indicaciones que el médico le dé. °· Tome todos los medicamentos como le indicó el médico. °· Concurra a las visitas prenatales regulares. °· Coma y beba con moderación si cree que está en trabajo de parto. °· Si las contracciones de Braxton Hicks le provocan incomodidad: °¨ Cambie de posición: si está acostada o descansando, camine; si está caminando, descanse. °¨ Siéntese y descanse en una bañera con agua tibia. °¨ Beba 2 o 3 vasos de agua. La deshidratación puede provocar contracciones. °¨ Respire lenta y profundamente varias veces por hora. °¿CUÁNDO DEBO BUSCAR ASISTENCIA MÉDICA INMEDIATA? °Solicite atención médica de inmediato si: °· Las contracciones se intensifican, se hacen más regulares y cercanas entre sí. °· Tiene una pérdida de líquido por la vagina. °· Tiene fiebre. °· Elimina mucosidad manchada con sangre. °· Tiene una hemorragia vaginal abundante. °· Tiene dolor abdominal permanente. °· Tiene un dolor en la zona lumbar que nunca tuvo antes. °· Siente que la cabeza del bebé empuja hacia abajo y ejerce presión en la zona pélvica. °· El bebé no se mueve tanto como solía. °Document Released: 02/20/2005 Document Revised: 05/18/2013 °ExitCare® Patient   Information ©2015 [employer]. This information is not intended to replace advice given to you by your health care provider. Make sure you discuss any questions you have with your health care provider. ° °

## 2014-07-27 NOTE — Progress Notes (Signed)
30 weeks, stable without complaint.  Denies LOF, vag bleeding, dysuria.  Endorses good fetal movement.  PNV qd RTC 2 qweeks

## 2014-08-10 ENCOUNTER — Encounter: Payer: Self-pay | Admitting: Physician Assistant

## 2014-08-10 ENCOUNTER — Ambulatory Visit (INDEPENDENT_AMBULATORY_CARE_PROVIDER_SITE_OTHER): Payer: Self-pay | Admitting: Physician Assistant

## 2014-08-10 VITALS — BP 97/56 | HR 97 | Temp 97.4°F | Wt 169.3 lb

## 2014-08-10 DIAGNOSIS — O0993 Supervision of high risk pregnancy, unspecified, third trimester: Secondary | ICD-10-CM

## 2014-08-10 LAB — POCT URINALYSIS DIP (DEVICE)
Bilirubin Urine: NEGATIVE
Glucose, UA: NEGATIVE mg/dL
Hgb urine dipstick: NEGATIVE
Ketones, ur: NEGATIVE mg/dL
Leukocytes, UA: NEGATIVE
NITRITE: NEGATIVE
Protein, ur: NEGATIVE mg/dL
Specific Gravity, Urine: 1.015 (ref 1.005–1.030)
UROBILINOGEN UA: 0.2 mg/dL (ref 0.0–1.0)
pH: 6.5 (ref 5.0–8.0)

## 2014-08-10 NOTE — Progress Notes (Signed)
32 weeks.  Did have a cold but feeling better.  Denies LOF, vag bleeding, dyuris.  Endorses good fetal movement.   PTL precautions reviewed.   RTC 2 weeks ROB.

## 2014-08-10 NOTE — Patient Instructions (Signed)

## 2014-08-10 NOTE — Progress Notes (Signed)
Alis Herrera used as interpreter for this encounter 

## 2014-08-24 ENCOUNTER — Ambulatory Visit (INDEPENDENT_AMBULATORY_CARE_PROVIDER_SITE_OTHER): Payer: Self-pay | Admitting: Obstetrics and Gynecology

## 2014-08-24 ENCOUNTER — Encounter: Payer: Self-pay | Admitting: Obstetrics and Gynecology

## 2014-08-24 VITALS — BP 94/57 | HR 88 | Temp 98.5°F | Wt 172.3 lb

## 2014-08-24 DIAGNOSIS — Z3483 Encounter for supervision of other normal pregnancy, third trimester: Secondary | ICD-10-CM

## 2014-08-24 LAB — POCT URINALYSIS DIP (DEVICE)
Bilirubin Urine: NEGATIVE
Glucose, UA: NEGATIVE mg/dL
HGB URINE DIPSTICK: NEGATIVE
Ketones, ur: NEGATIVE mg/dL
Leukocytes, UA: NEGATIVE
Nitrite: NEGATIVE
PH: 6.5 (ref 5.0–8.0)
Protein, ur: NEGATIVE mg/dL
Specific Gravity, Urine: 1.025 (ref 1.005–1.030)
Urobilinogen, UA: 0.2 mg/dL (ref 0.0–1.0)

## 2014-08-24 NOTE — Progress Notes (Signed)
Research officer, trade unionpanish Interpreter for Principal FinancialEncounter Destiny Jacobs

## 2014-08-24 NOTE — Progress Notes (Signed)
Interpreter present Doing well except RLP> discussed relief measures.  Hx LBW but now states can't remember birth weights and all babies left hospital with her. S=D  Still thinking about Nexplanon. Encouraged LARC.

## 2014-09-07 ENCOUNTER — Ambulatory Visit (INDEPENDENT_AMBULATORY_CARE_PROVIDER_SITE_OTHER): Payer: Self-pay | Admitting: Certified Nurse Midwife

## 2014-09-07 ENCOUNTER — Other Ambulatory Visit: Payer: Self-pay | Admitting: Certified Nurse Midwife

## 2014-09-07 VITALS — BP 98/65 | HR 83 | Temp 97.6°F | Wt 172.6 lb

## 2014-09-07 DIAGNOSIS — Z3A36 36 weeks gestation of pregnancy: Secondary | ICD-10-CM

## 2014-09-07 DIAGNOSIS — Z3493 Encounter for supervision of normal pregnancy, unspecified, third trimester: Secondary | ICD-10-CM

## 2014-09-07 LAB — POCT URINALYSIS DIP (DEVICE)
Bilirubin Urine: NEGATIVE
Glucose, UA: NEGATIVE mg/dL
Hgb urine dipstick: NEGATIVE
Ketones, ur: NEGATIVE mg/dL
Leukocytes, UA: NEGATIVE
Nitrite: NEGATIVE
Protein, ur: NEGATIVE mg/dL
Specific Gravity, Urine: 1.015 (ref 1.005–1.030)
Urobilinogen, UA: 0.2 mg/dL (ref 0.0–1.0)
pH: 6 (ref 5.0–8.0)

## 2014-09-07 LAB — OB RESULTS CONSOLE GBS: STREP GROUP B AG: POSITIVE

## 2014-09-07 LAB — OB RESULTS CONSOLE GC/CHLAMYDIA
CHLAMYDIA, DNA PROBE: NEGATIVE
Gonorrhea: NEGATIVE

## 2014-09-07 NOTE — Progress Notes (Signed)
Alviris used for interpreter 

## 2014-09-07 NOTE — Patient Instructions (Signed)
Tercer trimestre de embarazo (Third Trimester of Pregnancy) El tercer trimestre va desde la semana29 hasta la 42, desde el sptimo hasta el noveno mes, y es la poca en la que el feto crece ms rpidamente. Hacia el final del noveno mes, el feto mide alrededor de 20pulgadas (45cm) de largo y pesa entre 6 y 10 libras (2,700 y 4,500kg).  CAMBIOS EN EL ORGANISMO Su organismo atraviesa por muchos cambios durante el embarazo, y estos varan de una mujer a otra.   Seguir aumentando de peso. Es de esperar que aumente entre 25 y 35libras (11 y 16kg) hacia el final del embarazo.  Podrn aparecer las primeras estras en las caderas, el abdomen y las mamas.  Puede tener necesidad de orinar con ms frecuencia porque el feto baja hacia la pelvis y ejerce presin sobre la vejiga.  Debido al embarazo podr sentir acidez estomacal con frecuencia.  Puede estar estreida, ya que ciertas hormonas enlentecen los movimientos de los msculos que empujan los desechos a travs de los intestinos.  Pueden aparecer hemorroides o abultarse e hincharse las venas (venas varicosas).  Puede sentir dolor plvico debido al aumento de peso y a que las hormonas del embarazo relajan las articulaciones entre los huesos de la pelvis. El dolor de espalda puede ser consecuencia de la sobrecarga de los msculos que soportan la postura.  Tal vez haya cambios en el cabello que pueden incluir su engrosamiento, crecimiento rpido y cambios en la textura. Adems, a algunas mujeres se les cae el cabello durante o despus del embarazo, o tienen el cabello seco o fino. Lo ms probable es que el cabello se le normalice despus del nacimiento del beb.  Las mamas seguirn creciendo y le dolern. A veces, puede haber una secrecin amarilla de las mamas llamada calostro.  El ombligo puede salir hacia afuera.  Puede sentir que le falta el aire debido a que se expande el tero.  Puede notar que el feto "baja" o lo siente ms bajo, en el  abdomen.  Puede tener una prdida de secrecin mucosa con sangre. Esto suele ocurrir en el trmino de unos pocos das a una semana antes de que comience el trabajo de parto.  El cuello del tero se vuelve delgado y blando (se borra) cerca de la fecha de parto. QU DEBE ESPERAR EN LOS EXMENES PRENATALES  Le harn exmenes prenatales cada 2semanas hasta la semana36. A partir de ese momento le harn exmenes semanales. Durante una visita prenatal de rutina:  La pesarn para asegurarse de que usted y el feto estn creciendo normalmente.  Le tomarn la presin arterial.  Le medirn el abdomen para controlar el desarrollo del beb.  Se escucharn los latidos cardacos fetales.  Se evaluarn los resultados de los estudios solicitados en visitas anteriores.  Le revisarn el cuello del tero cuando est prxima la fecha de parto para controlar si este se ha borrado. Alrededor de la semana36, el mdico le revisar el cuello del tero. Al mismo tiempo, realizar un anlisis de las secreciones del tejido vaginal. Este examen es para determinar si hay un tipo de bacteria, estreptococo Grupo B. El mdico le explicar esto con ms detalle. El mdico puede preguntarle lo siguiente:  Cmo le gustara que fuera el parto.  Cmo se siente.  Si siente los movimientos del beb.  Si ha tenido sntomas anormales, como prdida de lquido, sangrado, dolores de cabeza intensos o clicos abdominales.  Si tiene alguna pregunta. Otros exmenes o estudios de deteccin que pueden realizarse   durante el tercer trimestre incluyen lo siguiente:  Anlisis de sangre para controlar las concentraciones de hierro (anemia).  Controles fetales para determinar su salud, nivel de actividad y crecimiento. Si tiene alguna enfermedad o hay problemas durante el embarazo, le harn estudios. FALSO TRABAJO DE PARTO Es posible que sienta contracciones leves e irregulares que finalmente desaparecen. Se llaman contracciones de  Braxton Hicks o falso trabajo de parto. Las contracciones pueden durar horas, das o incluso semanas, antes de que el verdadero trabajo de parto se inicie. Si las contracciones ocurren a intervalos regulares, se intensifican o se hacen dolorosas, lo mejor es que la revise el mdico.  SIGNOS DE TRABAJO DE PARTO   Clicos de tipo menstrual.  Contracciones cada 5minutos o menos.  Contracciones que comienzan en la parte superior del tero y se extienden hacia abajo, a la zona inferior del abdomen y la espalda.  Sensacin de mayor presin en la pelvis o dolor de espalda.  Una secrecin de mucosidad acuosa o con sangre que sale de la vagina. Si tiene alguno de estos signos antes de la semana37 del embarazo, llame a su mdico de inmediato. Debe concurrir al hospital para que la controlen inmediatamente. INSTRUCCIONES PARA EL CUIDADO EN EL HOGAR   Evite fumar, consumir hierbas, beber alcohol y tomar frmacos que no le hayan recetado. Estas sustancias qumicas afectan la formacin y el desarrollo del beb.  Siga las indicaciones del mdico en relacin con el uso de medicamentos. Durante el embarazo, hay medicamentos que son seguros de tomar y otros que no.  Haga actividad fsica solo en la forma indicada por el mdico. Sentir clicos uterinos es un buen signo para detener la actividad fsica.  Contine comiendo alimentos que sanos con regularidad.  Use un sostn que le brinde buen soporte si le duelen las mamas.  No se d baos de inmersin en agua caliente, baos turcos ni saunas.  Colquese el cinturn de seguridad cuando conduzca.  No coma carne cruda ni queso sin cocinar; evite el contacto con las bandejas sanitarias de los gatos y la tierra que estos animales usan. Estos elementos contienen grmenes que pueden causar defectos congnitos en el beb.  Tome las vitaminas prenatales.  Si est estreida, pruebe un laxante suave (si el mdico lo autoriza). Consuma ms alimentos ricos en  fibra, como vegetales y frutas frescos y cereales integrales. Beba gran cantidad de lquido para mantener la orina de tono claro o color amarillo plido.  Dese baos de asiento con agua tibia para aliviar el dolor o las molestias causadas por las hemorroides. Use una crema para las hemorroides si el mdico la autoriza.  Si tiene venas varicosas, use medias de descanso. Eleve los pies durante 15minutos, 3 o 4veces por da. Limite la cantidad de sal en su dieta.  Evite levantar objetos pesados, use zapatos de tacones bajos y mantenga una buena postura.  Descanse con las piernas elevadas si tiene calambres o dolor de cintura.  Visite a su dentista si no lo ha hecho durante el embarazo. Use un cepillo de dientes blando para higienizarse los dientes y psese el hilo dental con suavidad.  Puede seguir manteniendo relaciones sexuales, a menos que el mdico le indique lo contrario.  No haga viajes largos excepto que sea absolutamente necesario y solo con la autorizacin del mdico.  Tome clases prenatales para entender, practicar y hacer preguntas sobre el trabajo de parto y el parto.  Haga un ensayo de la partida al hospital.  Prepare el bolso que   llevar al hospital.  Prepare la habitacin del beb.  Concurra a todas las visitas prenatales segn las indicaciones de su mdico. SOLICITE ATENCIN MDICA SI:  No est segura de que est en trabajo de parto o de que ha roto la bolsa de las aguas.  Tiene mareos.  Siente clicos leves, presin en la pelvis o dolor persistente en el abdomen.  Tiene nuseas, vmitos o diarrea persistentes.  Tiene secrecin vaginal con mal olor.  Siente dolor al orinar. SOLICITE ATENCIN MDICA DE INMEDIATO SI:   Tiene fiebre.  Tiene una prdida de lquido por la vagina.  Tiene sangrado o pequeas prdidas vaginales.  Siente dolor intenso o clicos en el abdomen.  Sube o baja de peso rpidamente.  Tiene dificultad para respirar y siente dolor de  pecho.  Sbitamente se le hinchan mucho el rostro, las manos, los tobillos, los pies o las piernas.  No ha sentido los movimientos del beb durante una hora.  Siente un dolor de cabeza intenso que no se alivia con medicamentos.  Hay cambios en la visin. Document Released: 02/20/2005 Document Revised: 05/18/2013 ExitCare Patient Information 2015 [employer]. This information is not intended to replace advice given to you by your health care provider. Make sure you discuss any questions you have with your health care provider.  

## 2014-09-07 NOTE — Progress Notes (Signed)
Interpreter present GC /Ch GBS cx done today Doing well. No complaints

## 2014-09-08 LAB — GC/CHLAMYDIA PROBE AMP
CT Probe RNA: NEGATIVE
GC Probe RNA: NEGATIVE

## 2014-09-09 LAB — CULTURE, BETA STREP (GROUP B ONLY)

## 2014-09-14 ENCOUNTER — Ambulatory Visit (INDEPENDENT_AMBULATORY_CARE_PROVIDER_SITE_OTHER): Payer: Self-pay | Admitting: Advanced Practice Midwife

## 2014-09-14 VITALS — BP 97/61 | HR 80 | Temp 97.6°F | Wt 173.3 lb

## 2014-09-14 DIAGNOSIS — Z3483 Encounter for supervision of other normal pregnancy, third trimester: Secondary | ICD-10-CM

## 2014-09-14 LAB — POCT URINALYSIS DIP (DEVICE)
Bilirubin Urine: NEGATIVE
Glucose, UA: NEGATIVE mg/dL
Hgb urine dipstick: NEGATIVE
Ketones, ur: NEGATIVE mg/dL
Leukocytes, UA: NEGATIVE
Nitrite: NEGATIVE
PH: 5 (ref 5.0–8.0)
PROTEIN: NEGATIVE mg/dL
Specific Gravity, Urine: <=1.005 (ref 1.005–1.030)
UROBILINOGEN UA: 0.2 mg/dL (ref 0.0–1.0)

## 2014-09-14 NOTE — Progress Notes (Signed)
MariaElena used as interpreter for this encounter.  

## 2014-09-14 NOTE — Progress Notes (Signed)
Doing well.  Good fetal movement, denies vaginal bleeding, LOF, regular contractions.  Does report irregular sharp pains in LLQ, increasing with movement/position change.  Discussed round ligament pain, recommend rest/ice/heat/warm bath/Tylenol.  Cervical exam upon request.

## 2014-09-16 ENCOUNTER — Telehealth: Payer: Self-pay | Admitting: General Practice

## 2014-09-16 NOTE — Telephone Encounter (Signed)
Patient came by front office stating when she went to the bathroom last night and wiped she saw yellow mucous d/c. Patient denies bleeding, itch, odor or water breaking. Told patient that it sounds like a normal d/c she can have in pregnancy as part of the body's way preparing for labor. Patient verbalized understanding and had no other questions. Beronica used for discussion

## 2014-09-21 ENCOUNTER — Ambulatory Visit (INDEPENDENT_AMBULATORY_CARE_PROVIDER_SITE_OTHER): Payer: Self-pay | Admitting: Advanced Practice Midwife

## 2014-09-21 VITALS — BP 107/63 | HR 91 | Temp 97.9°F | Wt 175.1 lb

## 2014-09-21 DIAGNOSIS — Z3483 Encounter for supervision of other normal pregnancy, third trimester: Secondary | ICD-10-CM

## 2014-09-21 LAB — POCT URINALYSIS DIP (DEVICE)
Bilirubin Urine: NEGATIVE
Glucose, UA: NEGATIVE mg/dL
Hgb urine dipstick: NEGATIVE
Ketones, ur: NEGATIVE mg/dL
Leukocytes, UA: NEGATIVE
Nitrite: NEGATIVE
PH: 6 (ref 5.0–8.0)
PROTEIN: NEGATIVE mg/dL
Specific Gravity, Urine: 1.01 (ref 1.005–1.030)
Urobilinogen, UA: 0.2 mg/dL (ref 0.0–1.0)

## 2014-09-21 NOTE — Progress Notes (Signed)
Doing well.  Good fetal movement, denies vaginal bleeding, LOF, regular contractions.  Labor precautions given. Hx LBW x 3, feels like this baby is bigger. Destiny Jacobs used as interpreter for all communication.

## 2014-09-21 NOTE — Progress Notes (Signed)
Destiny Jacobs used as interpreter for this encounter.

## 2014-09-25 ENCOUNTER — Inpatient Hospital Stay (HOSPITAL_COMMUNITY)
Admission: AD | Admit: 2014-09-25 | Discharge: 2014-09-25 | Disposition: A | Discharge status: Home / Self Care | Payer: Self-pay | Source: Ambulatory Visit | Attending: Obstetrics and Gynecology | Admitting: Obstetrics and Gynecology

## 2014-09-25 ENCOUNTER — Encounter (HOSPITAL_COMMUNITY): Payer: Self-pay | Admitting: *Deleted

## 2014-09-25 DIAGNOSIS — R109 Unspecified abdominal pain: Secondary | ICD-10-CM | POA: Insufficient documentation

## 2014-09-25 DIAGNOSIS — O9989 Other specified diseases and conditions complicating pregnancy, childbirth and the puerperium: Secondary | ICD-10-CM | POA: Insufficient documentation

## 2014-09-25 DIAGNOSIS — Z3A39 39 weeks gestation of pregnancy: Secondary | ICD-10-CM | POA: Insufficient documentation

## 2014-09-25 NOTE — MAU Note (Signed)
Pt states has pain on her left side that goes down to her groin that started today, she says it comes and goes.  Denies LOF/VB.

## 2014-09-25 NOTE — Progress Notes (Signed)
Assisted pharmacy tech with interpretation of questions concerning medications.  Spanish Interpreter Joselyn GlassmanBenita Sanchez

## 2014-09-25 NOTE — Progress Notes (Signed)
Assisted RN with interpretation of discharge instructions  °Spanish Interpreter - Benita Sanchez °

## 2014-09-25 NOTE — Discharge Instructions (Signed)
Braxton Hicks Contractions °Contractions of the uterus can occur throughout pregnancy. Contractions are not always a sign that you are in labor.  °WHAT ARE BRAXTON HICKS CONTRACTIONS?  °Contractions that occur before labor are called Braxton Hicks contractions, or false labor. Toward the end of pregnancy (32-34 weeks), these contractions can develop more often and may become more forceful. This is not true labor because these contractions do not result in opening (dilatation) and thinning of the cervix. They are sometimes difficult to tell apart from true labor because these contractions can be forceful and people have different pain tolerances. You should not feel embarrassed if you go to the hospital with false labor. Sometimes, the only way to tell if you are in true labor is for your health care provider to look for changes in the cervix. °If there are no prenatal problems or other health problems associated with the pregnancy, it is completely safe to be sent home with false labor and await the onset of true labor. °HOW CAN YOU TELL THE DIFFERENCE BETWEEN TRUE AND FALSE LABOR? °False Labor °· The contractions of false labor are usually shorter and not as hard as those of true labor.   °· The contractions are usually irregular.   °· The contractions are often felt in the front of the lower abdomen and in the groin.   °· The contractions may go away when you walk around or change positions while lying down.   °· The contractions get weaker and are shorter lasting as time goes on.   °· The contractions do not usually become progressively stronger, regular, and closer together as with true labor.   °True Labor °1. Contractions in true labor last 30-70 seconds, become very regular, usually become more intense, and increase in frequency.   °2. The contractions do not go away with walking.   °3. The discomfort is usually felt in the top of the uterus and spreads to the lower abdomen and low back.   °4. True labor can  be determined by your health care provider with an exam. This will show that the cervix is dilating and getting thinner.   °WHAT TO REMEMBER °· Keep up with your usual exercises and follow other instructions given by your health care provider.   °· Take medicines as directed by your health care provider.   °· Keep your regular prenatal appointments.   °· Eat and drink lightly if you think you are going into labor.   °· If Braxton Hicks contractions are making you uncomfortable:   °· Change your position from lying down or resting to walking, or from walking to resting.   °· Sit and rest in a tub of warm water.   °· Drink 2-3 glasses of water. Dehydration may cause these contractions.   °· Do slow and deep breathing several times an hour.   °WHEN SHOULD I SEEK IMMEDIATE MEDICAL CARE? °Seek immediate medical care if: °· Your contractions become stronger, more regular, and closer together.   °· You have fluid leaking or gushing from your vagina.   °· You have a fever.   °· You pass blood-tinged mucus.   °· You have vaginal bleeding.   °· You have continuous abdominal pain.   °· You have low back pain that you never had before.   °· You feel your baby's head pushing down and causing pelvic pressure.   °· Your baby is not moving as much as it used to.   °Document Released: 05/13/2005 Document Revised: 05/18/2013 Document Reviewed: 02/22/2013 °ExitCare® Patient Information ©2015 [employer]. This information is not intended to replace advice given to you by your health care   provider. Make sure you discuss any questions you have with your health care provider. ° °Fetal Movement Counts °Patient Name: __________________________________________________ Patient Due Date: ____________________ °Performing a fetal movement count is highly recommended in high-risk pregnancies, but it is good for every pregnant woman to do. Your health care provider may ask you to start counting fetal movements at 28 weeks of the pregnancy. Fetal  movements often increase: °· After eating a full meal. °· After physical activity. °· After eating or drinking something sweet or cold. °· At rest. °Pay attention to when you feel the baby is most active. This will help you notice a pattern of your baby's sleep and wake cycles and what factors contribute to an increase in fetal movement. It is important to perform a fetal movement count at the same time each day when your baby is normally most active.  °HOW TO COUNT FETAL MOVEMENTS °5. Find a quiet and comfortable area to sit or lie down on your left side. Lying on your left side provides the best blood and oxygen circulation to your baby. °6. Write down the day and time on a sheet of paper or in a journal. °7. Start counting kicks, flutters, swishes, rolls, or jabs in a 2-hour period. You should feel at least 10 movements within 2 hours. °8. If you do not feel 10 movements in 2 hours, wait 2-3 hours and count again. Look for a change in the pattern or not enough counts in 2 hours. °SEEK MEDICAL CARE IF: °· You feel less than 10 counts in 2 hours, tried twice. °· There is no movement in over an hour. °· The pattern is changing or taking longer each day to reach 10 counts in 2 hours. °· You feel the baby is not moving as he or she usually does. °Date: ____________ Movements: ____________ Start time: ____________ Finish time: ____________  °Date: ____________ Movements: ____________ Start time: ____________ Finish time: ____________ °Date: ____________ Movements: ____________ Start time: ____________ Finish time: ____________ °Date: ____________ Movements: ____________ Start time: ____________ Finish time: ____________ °Date: ____________ Movements: ____________ Start time: ____________ Finish time: ____________ °Date: ____________ Movements: ____________ Start time: ____________ Finish time: ____________ °Date: ____________ Movements: ____________ Start time: ____________ Finish time: ____________ °Date: ____________  Movements: ____________ Start time: ____________ Finish time: ____________  °Date: ____________ Movements: ____________ Start time: ____________ Finish time: ____________ °Date: ____________ Movements: ____________ Start time: ____________ Finish time: ____________ °Date: ____________ Movements: ____________ Start time: ____________ Finish time: ____________ °Date: ____________ Movements: ____________ Start time: ____________ Finish time: ____________ °Date: ____________ Movements: ____________ Start time: ____________ Finish time: ____________ °Date: ____________ Movements: ____________ Start time: ____________ Finish time: ____________ °Date: ____________ Movements: ____________ Start time: ____________ Finish time: ____________  °Date: ____________ Movements: ____________ Start time: ____________ Finish time: ____________ °Date: ____________ Movements: ____________ Start time: ____________ Finish time: ____________ °Date: ____________ Movements: ____________ Start time: ____________ Finish time: ____________ °Date: ____________ Movements: ____________ Start time: ____________ Finish time: ____________ °Date: ____________ Movements: ____________ Start time: ____________ Finish time: ____________ °Date: ____________ Movements: ____________ Start time: ____________ Finish time: ____________ °Date: ____________ Movements: ____________ Start time: ____________ Finish time: ____________  °Date: ____________ Movements: ____________ Start time: ____________ Finish time: ____________ °Date: ____________ Movements: ____________ Start time: ____________ Finish time: ____________ °Date: ____________ Movements: ____________ Start time: ____________ Finish time: ____________ °Date: ____________ Movements: ____________ Start time: ____________ Finish time: ____________ °Date: ____________ Movements: ____________ Start time: ____________ Finish time: ____________ °Date: ____________ Movements: ____________ Start time:  ____________ Finish time: ____________ °Date: ____________ Movements:   ____________ Start time: ____________ Finish time: ____________  °Date: ____________ Movements: ____________ Start time: ____________ Finish time: ____________ °Date: ____________ Movements: ____________ Start time: ____________ Finish time: ____________ °Date: ____________ Movements: ____________ Start time: ____________ Finish time: ____________ °Date: ____________ Movements: ____________ Start time: ____________ Finish time: ____________ °Date: ____________ Movements: ____________ Start time: ____________ Finish time: ____________ °Date: ____________ Movements: ____________ Start time: ____________ Finish time: ____________ °Date: ____________ Movements: ____________ Start time: ____________ Finish time: ____________  °Date: ____________ Movements: ____________ Start time: ____________ Finish time: ____________ °Date: ____________ Movements: ____________ Start time: ____________ Finish time: ____________ °Date: ____________ Movements: ____________ Start time: ____________ Finish time: ____________ °Date: ____________ Movements: ____________ Start time: ____________ Finish time: ____________ °Date: ____________ Movements: ____________ Start time: ____________ Finish time: ____________ °Date: ____________ Movements: ____________ Start time: ____________ Finish time: ____________ °Date: ____________ Movements: ____________ Start time: ____________ Finish time: ____________  °Date: ____________ Movements: ____________ Start time: ____________ Finish time: ____________ °Date: ____________ Movements: ____________ Start time: ____________ Finish time: ____________ °Date: ____________ Movements: ____________ Start time: ____________ Finish time: ____________ °Date: ____________ Movements: ____________ Start time: ____________ Finish time: ____________ °Date: ____________ Movements: ____________ Start time: ____________ Finish time: ____________ °Date:  ____________ Movements: ____________ Start time: ____________ Finish time: ____________ °Date: ____________ Movements: ____________ Start time: ____________ Finish time: ____________  °Date: ____________ Movements: ____________ Start time: ____________ Finish time: ____________ °Date: ____________ Movements: ____________ Start time: ____________ Finish time: ____________ °Date: ____________ Movements: ____________ Start time: ____________ Finish time: ____________ °Date: ____________ Movements: ____________ Start time: ____________ Finish time: ____________ °Date: ____________ Movements: ____________ Start time: ____________ Finish time: ____________ °Date: ____________ Movements: ____________ Start time: ____________ Finish time: ____________ °Document Released: 06/12/2006 Document Revised: 09/27/2013 Document Reviewed: 03/09/2012 °ExitCare® Patient Information ©2015 [employer]. This information is not intended to replace advice given to you by your health care provider. Make sure you discuss any questions you have with your health care provider. ° °

## 2014-09-28 ENCOUNTER — Ambulatory Visit (INDEPENDENT_AMBULATORY_CARE_PROVIDER_SITE_OTHER): Payer: Self-pay | Admitting: Certified Nurse Midwife

## 2014-09-28 VITALS — BP 105/66 | HR 85 | Temp 98.4°F | Wt 174.1 lb

## 2014-09-28 DIAGNOSIS — Z3493 Encounter for supervision of normal pregnancy, unspecified, third trimester: Secondary | ICD-10-CM

## 2014-09-28 LAB — POCT URINALYSIS DIP (DEVICE)
Bilirubin Urine: NEGATIVE
Glucose, UA: NEGATIVE mg/dL
Hgb urine dipstick: NEGATIVE
Ketones, ur: NEGATIVE mg/dL
Leukocytes, UA: NEGATIVE
Nitrite: NEGATIVE
Protein, ur: NEGATIVE mg/dL
Specific Gravity, Urine: 1.02 (ref 1.005–1.030)
Urobilinogen, UA: 0.2 mg/dL (ref 0.0–1.0)
pH: 6.5 (ref 5.0–8.0)

## 2014-09-28 NOTE — Progress Notes (Signed)
Spanish Interpreter Viviana SimplerAlis Jacobs

## 2014-10-03 ENCOUNTER — Ambulatory Visit (INDEPENDENT_AMBULATORY_CARE_PROVIDER_SITE_OTHER): Payer: Self-pay | Admitting: Family Medicine

## 2014-10-03 VITALS — BP 98/66 | HR 89 | Temp 97.6°F | Wt 176.1 lb

## 2014-10-03 DIAGNOSIS — O480 Post-term pregnancy: Secondary | ICD-10-CM

## 2014-10-03 NOTE — Progress Notes (Signed)
Patient is 34 y.o. [id number] 6081w1d.  +FM, denies LOF, VB, vaginal discharge.  Overall feeling well.  Rare contractions NST reactive  Twice weekly fetal testing started, IOL scheduled 5/16 @ 0730.  Diane Day RNC

## 2014-10-03 NOTE — Progress Notes (Signed)
Destiny Jacobs used for interpreter

## 2014-10-06 ENCOUNTER — Telehealth (HOSPITAL_COMMUNITY): Payer: Self-pay | Admitting: *Deleted

## 2014-10-06 ENCOUNTER — Ambulatory Visit (INDEPENDENT_AMBULATORY_CARE_PROVIDER_SITE_OTHER): Payer: Self-pay | Admitting: *Deleted

## 2014-10-06 VITALS — BP 103/56 | HR 101

## 2014-10-06 DIAGNOSIS — O480 Post-term pregnancy: Secondary | ICD-10-CM

## 2014-10-06 NOTE — Progress Notes (Signed)
Interpreter Amanda PeaAlvi Jacobs present for encounter.  IOL scheduled 10/10/14.  Labor sx reviewed.

## 2014-10-06 NOTE — Telephone Encounter (Signed)
Preadmission screen Interpreter number [phone]

## 2014-10-06 NOTE — Progress Notes (Signed)
NST performed today was reviewed and was found to be reactive.  AFI normal at 11.9 cm.  Cephalic presentation.  IOL on 10/10/14.

## 2014-10-10 ENCOUNTER — Inpatient Hospital Stay (HOSPITAL_COMMUNITY)
Admission: RE | Admit: 2014-10-10 | Discharge: 2014-10-13 | DRG: 775 | Disposition: A | Discharge status: Home / Self Care | Payer: Medicaid Other | Source: Ambulatory Visit | Attending: Family Medicine | Admitting: Family Medicine

## 2014-10-10 ENCOUNTER — Encounter (HOSPITAL_COMMUNITY): Payer: Self-pay

## 2014-10-10 ENCOUNTER — Inpatient Hospital Stay (HOSPITAL_COMMUNITY): Payer: Medicaid Other | Admitting: Anesthesiology

## 2014-10-10 DIAGNOSIS — O481 Prolonged pregnancy: Secondary | ICD-10-CM | POA: Diagnosis present

## 2014-10-10 DIAGNOSIS — O99824 Streptococcus B carrier state complicating childbirth: Secondary | ICD-10-CM | POA: Diagnosis present

## 2014-10-10 DIAGNOSIS — Z3A41 41 weeks gestation of pregnancy: Secondary | ICD-10-CM | POA: Diagnosis present

## 2014-10-10 DIAGNOSIS — Z3483 Encounter for supervision of other normal pregnancy, third trimester: Secondary | ICD-10-CM | POA: Diagnosis present

## 2014-10-10 LAB — CBC
HCT: 37.1 % (ref 36.0–46.0)
Hemoglobin: 11.8 g/dL — ABNORMAL LOW (ref 12.0–15.0)
MCH: 25.1 pg — ABNORMAL LOW (ref 26.0–34.0)
MCHC: 31.8 g/dL (ref 30.0–36.0)
MCV: 78.8 fL (ref 78.0–100.0)
Platelets: 480 10*3/uL — ABNORMAL HIGH (ref 150–400)
RBC: 4.71 MIL/uL (ref 3.87–5.11)
RDW: 15.5 % (ref 11.5–15.5)
WBC: 9.5 10*3/uL (ref 4.0–10.5)

## 2014-10-10 LAB — TYPE AND SCREEN
ABO/RH(D): O POS
Antibody Screen: NEGATIVE

## 2014-10-10 LAB — ABO/RH: ABO/RH(D): O POS

## 2014-10-10 MED ORDER — CITRIC ACID-SODIUM CITRATE 334-500 MG/5ML PO SOLN: 30.0000 mL | ORAL | Status: DC | PRN

## 2014-10-10 MED ORDER — EPHEDRINE 5 MG/ML INJ
10.0000 mg | INTRAVENOUS | Status: DC | PRN
  Filled 2014-10-10: qty 2

## 2014-10-10 MED ORDER — LIDOCAINE HCL (PF) 1 % IJ SOLN
30.0000 mL | INTRAMUSCULAR | Status: DC | PRN
  Filled 2014-10-10: qty 30

## 2014-10-10 MED ORDER — LACTATED RINGERS IV SOLN
500.0000 mL | INTRAVENOUS | Status: DC | PRN
  Administered 2014-10-10: 500 mL via INTRAVENOUS

## 2014-10-10 MED ORDER — OXYCODONE-ACETAMINOPHEN 5-325 MG PO TABS: 1.0000 | ORAL_TABLET | ORAL | Status: DC | PRN

## 2014-10-10 MED ORDER — TERBUTALINE SULFATE 1 MG/ML IJ SOLN: 0.2500 mg | Freq: Once | INTRAMUSCULAR | Status: AC | PRN

## 2014-10-10 MED ORDER — LACTATED RINGERS IV SOLN
INTRAVENOUS | Status: DC
  Administered 2014-10-10 (×3): via INTRAVENOUS

## 2014-10-10 MED ORDER — ONDANSETRON HCL 4 MG/2ML IJ SOLN: 4.0000 mg | Freq: Four times a day (QID) | INTRAMUSCULAR | Status: DC | PRN

## 2014-10-10 MED ORDER — DIPHENHYDRAMINE HCL 50 MG/ML IJ SOLN: 12.5000 mg | INTRAMUSCULAR | Status: DC | PRN

## 2014-10-10 MED ORDER — OXYTOCIN 40 UNITS IN LACTATED RINGERS INFUSION - SIMPLE MED: 62.5000 mL/h | INTRAVENOUS | Status: DC

## 2014-10-10 MED ORDER — OXYCODONE-ACETAMINOPHEN 5-325 MG PO TABS: 2.0000 | ORAL_TABLET | ORAL | Status: DC | PRN

## 2014-10-10 MED ORDER — FENTANYL 2.5 MCG/ML BUPIVACAINE 1/10 % EPIDURAL INFUSION (WH - ANES)
14.0000 mL/h | INTRAMUSCULAR | Status: DC | PRN
  Administered 2014-10-10: 12 mL/h via EPIDURAL
  Filled 2014-10-10: qty 125

## 2014-10-10 MED ORDER — PHENYLEPHRINE 40 MCG/ML (10ML) SYRINGE FOR IV PUSH (FOR BLOOD PRESSURE SUPPORT)
80.0000 ug | PREFILLED_SYRINGE | INTRAVENOUS | Status: DC | PRN
  Filled 2014-10-10: qty 20
  Filled 2014-10-10: qty 2

## 2014-10-10 MED ORDER — PENICILLIN G POTASSIUM 5000000 UNITS IJ SOLR
5.0000 10*6.[IU] | Freq: Once | INTRAMUSCULAR | Status: AC
  Administered 2014-10-10: 5 10*6.[IU] via INTRAVENOUS
  Filled 2014-10-10: qty 5

## 2014-10-10 MED ORDER — OXYTOCIN 40 UNITS IN LACTATED RINGERS INFUSION - SIMPLE MED
1.0000 m[IU]/min | INTRAVENOUS | Status: DC
  Administered 2014-10-10: 2 m[IU]/min via INTRAVENOUS
  Filled 2014-10-10: qty 1000

## 2014-10-10 MED ORDER — PENICILLIN G POTASSIUM 5000000 UNITS IJ SOLR
2.5000 10*6.[IU] | INTRAMUSCULAR | Status: DC
  Administered 2014-10-10 – 2014-10-11 (×2): 2.5 10*6.[IU] via INTRAVENOUS
  Filled 2014-10-10 (×6): qty 2.5

## 2014-10-10 MED ORDER — OXYTOCIN BOLUS FROM INFUSION
500.0000 mL | INTRAVENOUS | Status: DC
  Administered 2014-10-11: 500 mL via INTRAVENOUS

## 2014-10-10 MED ORDER — ACETAMINOPHEN 325 MG PO TABS: 650.0000 mg | ORAL_TABLET | ORAL | Status: DC | PRN

## 2014-10-10 MED ORDER — LIDOCAINE-EPINEPHRINE (PF) 2 %-1:200000 IJ SOLN
INTRAMUSCULAR | Status: DC | PRN
  Administered 2014-10-10: 3 mL

## 2014-10-10 MED ORDER — MISOPROSTOL 25 MCG QUARTER TABLET
25.0000 ug | ORAL_TABLET | ORAL | Status: DC
  Administered 2014-10-10: 25 ug via VAGINAL
  Filled 2014-10-10: qty 1
  Filled 2014-10-10: qty 0.25
  Filled 2014-10-10: qty 1

## 2014-10-10 MED ORDER — BUPIVACAINE HCL (PF) 0.25 % IJ SOLN
INTRAMUSCULAR | Status: DC | PRN
  Administered 2014-10-10 (×2): 4 mL

## 2014-10-10 MED ORDER — FENTANYL CITRATE (PF) 100 MCG/2ML IJ SOLN: 100.0000 ug | INTRAMUSCULAR | Status: DC | PRN

## 2014-10-10 NOTE — Progress Notes (Signed)
This note also relates to the following rows which could not be included: Dose (milli-units/min) Oxytocin - Cannot attach notes to extension rows Rate (mL/hr) Oxytocin - Cannot attach notes to extension rows Concentration Oxytocin - Cannot attach notes to extension rows   Pt told rn and MD that baby is moving increased amounts at this time

## 2014-10-10 NOTE — Progress Notes (Signed)
Patient wishes to use her Sister in Social workerlaw  As her Interpreter. I was present during all the questions with Misty StanleyLisa, RN [facility] Royal  Interpreter.

## 2014-10-10 NOTE — Anesthesia Preprocedure Evaluation (Signed)
Anesthesia Evaluation  Patient identified by MRN, date of birth, ID band Patient awake    Reviewed: Allergy & Precautions, NPO status , Patient's Chart, lab work & pertinent test results  History of Anesthesia Complications Negative for: history of anesthetic complications  Airway Mallampati: III  TM Distance: >3 FB Neck ROM: Full    Dental  (+) Teeth Intact   Pulmonary neg pulmonary ROS,  breath sounds clear to auscultation        Cardiovascular negative cardio ROS  Rhythm:Regular     Neuro/Psych negative neurological ROS  negative psych ROS   GI/Hepatic negative GI ROS, Neg liver ROS,   Endo/Other  negative endocrine ROS  Renal/GU negative Renal ROS     Musculoskeletal   Abdominal   Peds  Hematology negative hematology ROS (+)   Anesthesia Other Findings   Reproductive/Obstetrics (+) Pregnancy                             Anesthesia Physical Anesthesia Plan  ASA: II  Anesthesia Plan: Epidural   Post-op Pain Management:    Induction:   Airway Management Planned:   Additional Equipment:   Intra-op Plan:   Post-operative Plan:   Informed Consent: I have reviewed the patients History and Physical, chart, labs and discussed the procedure including the risks, benefits and alternatives for the proposed anesthesia with the patient or authorized representative who has indicated his/her understanding and acceptance.     Plan Discussed with: Anesthesiologist  Anesthesia Plan Comments:         Anesthesia Quick Evaluation

## 2014-10-10 NOTE — Anesthesia Procedure Notes (Signed)
Epidural Patient location during procedure: OB  Staffing Anesthesiologist: Firmin Belisle, CHRIS Performed by: anesthesiologist   Preanesthetic Checklist Completed: patient identified, surgical consent, pre-op evaluation, timeout performed, IV checked, risks and benefits discussed and monitors and equipment checked  Epidural Patient position: sitting Prep: site prepped and draped and DuraPrep Patient monitoring: heart rate, cardiac monitor, continuous pulse ox and blood pressure Approach: midline Location: L3-L4 Injection technique: LOR saline  Needle:  Needle type: Tuohy  Needle gauge: 17 G Needle length: 9 cm Needle insertion depth: 7 cm Catheter type: closed end flexible Catheter size: 19 Gauge Catheter at skin depth: 14 cm Test dose: negative and 2% lidocaine with Epi 1:200 K  Assessment Events: blood not aspirated, injection not painful, no injection resistance, negative IV test and no paresthesia  Additional Notes H+P and labs checked, risks and benefits discussed with the patient, consent obtained, procedure tolerated well and without complications.  Reason for block:procedure for pain   

## 2014-10-10 NOTE — Progress Notes (Signed)
Labor Progress Note  ASSESSMENT:   Destiny Jacobs 34 y.o. [phone] at 7066w1d in induced labor 2/2 post dates  PLAN:  1) Labor curve reviewed.       Progress: Appropriate             Plan: S/P cytotec x 1 with dilation to 2 cm/50% effaced. Continue pitocin  2) Fetal heart tracing reviewed.    Cat I, reassuring  3) GBS Status - Positive, continue PCN  4) Other Problems Active Problems:   Prolonged pregnancy   SUBJECTIVE:  Feeling pain more with contractions. Otherwise doing well.    OBJECTIVE:  Vital Signs:  Patient Vitals for the past 2 hrs:  BP Temp Temp src Pulse Resp  10/10/14 1802 112/73 mmHg - - (!) 107 20  10/10/14 1732 115/71 mmHg - - 94 20  10/10/14 1654 123/80 mmHg 98.2 F (36.8 C) Oral 96 18   SVE: Dilation: 3, Effacement (%): 50, Station: -2  FHR Monitoring Baseline Rate (A): 150 bpm/+ accels/no decels/mod variability   Accelerations: 15 x 15 Contraction Frequency (min): 2-4   Caryl AdaJazma Tilmon Wisehart, DO 10/10/2014, 7:01 PM PGY-1, [facility]

## 2014-10-10 NOTE — Progress Notes (Signed)
Bedside us per Dr. Doroteo GlassmanPhelps to confirm vtx

## 2014-10-10 NOTE — Progress Notes (Signed)
I assisted Dr Maple HudsonMoser with explanation about Epidural, by Orlan LeavensViria Alvarez Spanish Interpreter

## 2014-10-10 NOTE — Progress Notes (Signed)
I assisted Rn with some instructions about plan of care, by Orlan LeavensViria Alvarez Spanish Interpreter

## 2014-10-10 NOTE — H&P (Signed)
LABOR ADMISSION HISTORY AND PHYSICAL  Destiny Jacobs is a 34 y.o. female [phone] with IUP at 6740w1d by LMP presenting for induction of labor due to prolonged pregnancy. She reports +FM, some mild peripheral edema, + contractions, no VB, no blurry vision, headaches, and RUQ pain.  She plans on breast and bottle feeding. She request Nexplanon for birth control.  Dating: By LMP consistent with 6wk US--->  Estimated Date of Delivery: 10/02/14  Prenatal History/Complications:  Past Medical History: Past Medical History  Diagnosis Date  . Medical history non-contributory     Past Surgical History: Past Surgical History  Procedure Laterality Date  . No past surgeries      Obstetrical History: OB History    Gravida Para Term Preterm AB TAB SAB Ectopic Multiple Living   5 3 3  1  1   3       Social History: History   Social History  . Marital Status: Single    Spouse Name: N/A  . Number of Children: N/A  . Years of Education: N/A   Social History Main Topics  . Smoking status: Never Smoker   . Smokeless tobacco: Not on file  . Alcohol Use: No  . Drug Use: No  . Sexual Activity: Yes   Other Topics Concern  . None   Social History Narrative    Family History: Family History  Problem Relation Age of Onset  . Diabetes Father     Allergies: No Known Allergies  No prescriptions prior to admission     Review of Systems   All systems reviewed and negative except as stated in HPI  Temp(Src) 98.1 F (36.7 C) (Oral)  Resp 18  Ht 5\' 4"  (1.626 m)  Wt 177 lb (80.287 kg)  BMI 30.37 kg/m2  LMP 12/26/2013 General appearance: alert, cooperative and no distress Lungs: clear to auscultation bilaterally Heart: regular rate and rhythm Abdomen: soft, non-tender; bowel sounds normal Pelvic: 0/thick/-3 Extremities: Homans sign is negative, no sign of DVT, trace edema Presentation: cephalic Fetal monitoringBaseline: 150 bpm, Variability: Good {> 6 bpm),  Accelerations: Reactive and Decelerations: Absent Uterine activityNone    Prenatal labs: ABO, Rh: O/Positive/-- (11/05 0000) Antibody: Negative (11/05 0000) Rubella:   RPR: NON REAC (02/17 1404)  HBsAg: Negative (11/05 0000)  HIV: NONREACTIVE (02/17 1404)  GBS: Positive (04/13 0000)  1 hr Glucola 75 Genetic screening  Neg Quad Anatomy US Normal  [facility]  Dating  6 week US and date Blood type: O/Positive/-- (11/05 0000)  Genetic Screen 1 Screen:    no             AFP:                    Quad: Neg  NIPS:no Antibody:Negative (11/05 0000)  Anatomic US Normal female Rubella: Immune (11/05 0000)  GTT Early:               Third trimester:  RPR: Nonreactive (11/05 0000)   TDaP vaccine  07/13/14 HBsAg: Negative (11/05 0000)   Flu vaccine 03/31/14 HIV: Non-reactive (11/05 0000)   GBS  GBS:   Contraception Nexplanon Pap:  Baby Food Bottle   Circumcision No   Pediatrician    Support Person     Prenatal Transfer Tool  Maternal Diabetes: No Genetic Screening: Normal Maternal Ultrasounds/Referrals: Normal Fetal Ultrasounds or other Referrals:  None Maternal Substance Abuse:  No Significant Maternal Medications:  None Significant Maternal Lab Results: Lab values include:  Group B Strep positive  Results for orders placed or performed during the hospital encounter of 10/10/14 (from the past 24 hour(s))  CBC   Collection Time: 10/10/14 10:16 AM  Result Value Ref Range   WBC 9.5 4.0 - 10.5 K/uL   RBC 4.71 3.87 - 5.11 MIL/uL   Hemoglobin 11.8 (L) 12.0 - 15.0 g/dL   HCT 04.537.1 40.936.0 - 81.146.0 %   MCV 78.8 78.0 - 100.0 fL   MCH 25.1 (L) 26.0 - 34.0 pg   MCHC 31.8 30.0 - 36.0 g/dL   RDW 91.415.5 78.211.5 - 95.615.5 %   Platelets 480 (H) 150 - 400 K/uL    Patient Active Problem List   Diagnosis Date Noted  . Prolonged pregnancy 10/10/2014  . Supervision of high risk pregnancy, antepartum 05/03/2014  . History of prior pregnancy with SGA newborn 05/03/2014    Assessment: Destiny Bootnedina  Salgado Jacobs is a 34 y.o. [id number] at 5956w1d here for IOL due to prolonged pregnancy.   #Labor: IOL with cytotec.  #Pain: Patient wants epidural eventually. Will wait until patient >4cm. Pain control with fentanyl and tylenol. #FWB:  Reassuring. Cat I strip with good accelerations.  #ID: GBS+, will begin Penicillin when cervix more favorable (~4cm) #MOF: Both #MOC: Nexplanon #Circ: No  Caryl AdaJazma Phelps, DO 10/10/2014, 11:32 AM PGY-1, [facility] FPTS Intern Pager: [phone], text pages welcome

## 2014-10-10 NOTE — Progress Notes (Addendum)
I stopped by to check on patients need I ordered her snack, and also I introduced myself letting her know I am the Interpreter on the second shift. By Orlan LeavensViria Alvarez Spanish Interpreter

## 2014-10-10 NOTE — Progress Notes (Signed)
Labor Progress Note  ASSESSMENT:   Destiny Jacobs 34 y.o. [phone] at 3097w1d in induced labor 2/2 post dates   PLAN:  1) Labor curve reviewed.       Progress: Appropriate             Plan: S/P cytotec x 1 with dilation to 2 cm/50% effaced  --> start pitocin  2) Fetal heart tracing reviewed.    Cat I, reassuring  3) GBS Status - Positive, start PCN  4) Other Problems Active Problems:   Prolonged pregnancy   SUBJECTIVE:  Starting to feel mild contractions   OBJECTIVE:  Vital Signs: No data found.  SVE: Dilation: 3, Effacement (%): 50, Station: Ballotable  FHR Monitoring Baseline Rate (A): 145 bpm/+ accels/no decels/mod variability   Accelerations: 15 x 15 Contraction Frequency (min): 2-6

## 2014-10-11 ENCOUNTER — Encounter (HOSPITAL_COMMUNITY): Payer: Self-pay

## 2014-10-11 DIAGNOSIS — O99824 Streptococcus B carrier state complicating childbirth: Secondary | ICD-10-CM

## 2014-10-11 DIAGNOSIS — Z3A41 41 weeks gestation of pregnancy: Secondary | ICD-10-CM

## 2014-10-11 LAB — HIV ANTIBODY (ROUTINE TESTING W REFLEX): HIV SCREEN 4TH GENERATION: NONREACTIVE

## 2014-10-11 LAB — RPR: RPR Ser Ql: NONREACTIVE

## 2014-10-11 MED ORDER — LANOLIN HYDROUS EX OINT: TOPICAL_OINTMENT | CUTANEOUS | Status: DC | PRN

## 2014-10-11 MED ORDER — PRENATAL MULTIVITAMIN CH
1.0000 | ORAL_TABLET | Freq: Every day | ORAL | Status: DC
  Administered 2014-10-11 – 2014-10-13 (×3): 1 via ORAL
  Filled 2014-10-11 (×3): qty 1

## 2014-10-11 MED ORDER — TETANUS-DIPHTH-ACELL PERTUSSIS 5-2.5-18.5 LF-MCG/0.5 IM SUSP: 0.5000 mL | Freq: Once | INTRAMUSCULAR | Status: DC

## 2014-10-11 MED ORDER — ONDANSETRON HCL 4 MG/2ML IJ SOLN: 4.0000 mg | INTRAMUSCULAR | Status: DC | PRN

## 2014-10-11 MED ORDER — OXYCODONE-ACETAMINOPHEN 5-325 MG PO TABS
1.0000 | ORAL_TABLET | ORAL | Status: DC | PRN
  Administered 2014-10-11 (×2): 1 via ORAL
  Filled 2014-10-11 (×2): qty 1

## 2014-10-11 MED ORDER — BENZOCAINE-MENTHOL 20-0.5 % EX AERO
1.0000 "application " | INHALATION_SPRAY | CUTANEOUS | Status: DC | PRN
  Administered 2014-10-12: 1 via TOPICAL
  Filled 2014-10-11: qty 56

## 2014-10-11 MED ORDER — ACETAMINOPHEN 325 MG PO TABS: 650.0000 mg | ORAL_TABLET | ORAL | Status: DC | PRN

## 2014-10-11 MED ORDER — ZOLPIDEM TARTRATE 5 MG PO TABS: 5.0000 mg | ORAL_TABLET | Freq: Every evening | ORAL | Status: DC | PRN

## 2014-10-11 MED ORDER — SIMETHICONE 80 MG PO CHEW: 80.0000 mg | CHEWABLE_TABLET | ORAL | Status: DC | PRN

## 2014-10-11 MED ORDER — SENNOSIDES-DOCUSATE SODIUM 8.6-50 MG PO TABS
2.0000 | ORAL_TABLET | ORAL | Status: DC
  Administered 2014-10-11 – 2014-10-12 (×2): 2 via ORAL
  Filled 2014-10-11 (×2): qty 2

## 2014-10-11 MED ORDER — DIPHENHYDRAMINE HCL 25 MG PO CAPS: 25.0000 mg | ORAL_CAPSULE | Freq: Four times a day (QID) | ORAL | Status: DC | PRN

## 2014-10-11 MED ORDER — DIBUCAINE 1 % RE OINT: 1.0000 "application " | TOPICAL_OINTMENT | RECTAL | Status: DC | PRN

## 2014-10-11 MED ORDER — ONDANSETRON HCL 4 MG PO TABS
4.0000 mg | ORAL_TABLET | ORAL | Status: DC | PRN
Start: 2014-10-11 — End: 2014-10-13

## 2014-10-11 MED ORDER — IBUPROFEN 600 MG PO TABS
600.0000 mg | ORAL_TABLET | Freq: Four times a day (QID) | ORAL | Status: DC
  Administered 2014-10-11 – 2014-10-13 (×8): 600 mg via ORAL
  Filled 2014-10-11 (×9): qty 1

## 2014-10-11 MED ORDER — OXYCODONE-ACETAMINOPHEN 5-325 MG PO TABS: 2.0000 | ORAL_TABLET | ORAL | Status: DC | PRN

## 2014-10-11 MED ORDER — WITCH HAZEL-GLYCERIN EX PADS: 1.0000 "application " | MEDICATED_PAD | CUTANEOUS | Status: DC | PRN

## 2014-10-11 NOTE — Progress Notes (Signed)
I assisted Dr Rittenhour with explanation of care plan for the baby.  Eda H Royal  Interpreter.

## 2014-10-11 NOTE — Progress Notes (Signed)
Patient ID: Destiny Jacobs, female   DOB: Apr 08, 1981, 34 y.o.   MRN: [id number] Comfortable with epidural  Filed Vitals:   10/10/14 2332 10/11/14 0003 10/11/14 0032 10/11/14 0041  BP: 90/48 95/45 95/51    Pulse: 77 79 80   Temp:    97.7 F (36.5 C)  TempSrc:    Oral  Resp: 20 20 20    Height:      Weight:      SpO2:        FHR reassuring UCs every 3-4 min  AROM clear fluid Dilation: 5.5 Effacement (%): 80 Cervical Position: Middle Station: -2 Presentation: Vertex Exam by:: Mayford KnifeWilliams, CNM  Will observe for increased labor

## 2014-10-11 NOTE — Progress Notes (Signed)
I stopped to check on patient's need I ordered her meals, by Destiny Jacobs Spanish Interpreter °

## 2014-10-11 NOTE — Anesthesia Postprocedure Evaluation (Signed)
Anesthesia Post Note  Patient: Destiny Jacobs  Procedure(s) Performed: * No procedures listed *  Anesthesia type: Epidural  Patient location: Mother/Baby  Post pain: Pain level controlled  Post assessment: Post-op Vital signs reviewed  Last Vitals:  Filed Vitals:   10/11/14 0819  BP: 103/65  Pulse: 63  Temp: 36.4 C  Resp: 19    Post vital signs: Reviewed  Level of consciousness:alert  Complications: No apparent anesthesia complications

## 2014-10-12 ENCOUNTER — Encounter (HOSPITAL_COMMUNITY): Payer: Self-pay

## 2014-10-12 NOTE — Progress Notes (Signed)
I assisted the Dr with some questions, by Orlan LeavensViria Alvarez spanish interpreter

## 2014-10-12 NOTE — Progress Notes (Addendum)
Post Partum Day 1 Subjective:  Destiny Jacobs is a 34 y.o. [id number] 5532w2d s/p SVD.  No acute events overnight.  Pt denies problems with ambulating, voiding or po intake.  She denies nausea or vomiting.  Pain is moderately controlled.  Plan for birth control is nexplanon.  Method of Feeding: breast  Objective: Blood pressure 107/69, pulse 95, temperature 98.7 F (37.1 C), temperature source Oral, resp. rate 16, height 5\' 4"  (1.626 m), weight 177 lb (80.287 kg), last menstrual period 12/26/2013, SpO2 100 %, unknown if currently breastfeeding.  Physical Exam:  General: alert, cooperative and no distress Lochia:normal flow Chest: CTAB Heart: RRR no m/r/g Abdomen: +BS, soft, nontender,  Uterine Fundus: firm DVT Evaluation: No evidence of DVT seen on physical exam. Extremities: no edema   Recent Labs  10/10/14 1016  HGB 11.8*  HCT 37.1    Assessment/Plan:  ASSESSMENT: Destiny Jacobs is a 34 y.o. [id number] 9432w2d s/p SVD  Plan for discharge tomorrow   LOS: 2 days   Tishie Altmann ROCIO 10/12/2014, 6:36 PM

## 2014-10-12 NOTE — Progress Notes (Signed)
I check on patients need I oredered her diiner, snack and breakfast, by Orlan LeavensViria Alvarez Spanish Interpreter

## 2014-10-12 NOTE — Lactation Note (Signed)
This note was copied from the chart of Destiny Jacobs. Lactation Consultation Note; Initial visit with [employer] Spanish interpreter [phone] on phone. Mom reports baby has been feeding on and off for 1 hour. Reports some pain with nursing. Encouraged to keep baby close to the breast- she is letting him slide down some. Latched baby easily by herself to other breast. Again reports some pain with latch- nipples look intact. No questions at present. Spanish BF brochure given. Reviewed importance of always breast feeding first then giving formula of baby is still hungry to promote a good milk supply. Mom reports she has been breast feeding baby more today To call prn  Patient Name: Destiny Jacobs EAVWU'JToday's Date: 10/12/2014 Reason for consult: Initial assessment   Maternal Data Formula Feeding for Exclusion: Yes Reason for exclusion: Mother's choice to formula and breast feed on admission Does the patient have breastfeeding experience prior to this delivery?: Yes  Feeding Feeding Type: Breast Fed Length of feed: 60 min (on and off)  LATCH Score/Interventions Latch: Grasps breast easily, tongue down, lips flanged, rhythmical sucking.  Audible Swallowing: A few with stimulation Intervention(s): Hand expression (encouraged sts) Intervention(s): Hand expression  Type of Nipple: Everted at rest and after stimulation  Comfort (Breast/Nipple): Filling, red/small blisters or bruises, mild/mod discomfort     Hold (Positioning): No assistance needed to correctly position infant at breast. Intervention(s): Breastfeeding basics reviewed;Support Pillows;Position options  LATCH Score: 8  Lactation Tools Discussed/Used     Consult Status Consult Status: Follow-up Date: 10/13/14 Follow-up type: In-patient    Pamelia HoitWeeks, Regis Hinton D 10/12/2014, 1:06 PM

## 2014-10-13 MED ORDER — IBUPROFEN 600 MG PO TABS: 600.0000 mg | ORAL_TABLET | Freq: Four times a day (QID) | ORAL | Status: DC

## 2014-10-13 MED ORDER — DOCUSATE SODIUM 100 MG PO CAPS: 100.0000 mg | ORAL_CAPSULE | Freq: Every day | ORAL | Status: DC | PRN

## 2014-10-13 NOTE — Discharge Summary (Signed)
Obstetric Discharge Summary Reason for Admission: induction of labor due to post-dates Prenatal Procedures: NST Intrapartum Procedures: spontaneous vaginal delivery and GBS prophylaxis Postpartum Procedures: none Complications-Operative and Postpartum: none HEMOGLOBIN  Date Value Ref Range Status  10/10/2014 11.8* 12.0 - 15.0 g/dL Final  16/10/960411/09/2013 54.012.4 g/dL Final   HCT  Date Value Ref Range Status  10/10/2014 37.1 36.0 - 46.0 % Final  03/31/2014 38 % Final    Discharge Diagnoses: Term Pregnancy-delivered  Hospital Course:  Destiny Jacobs is a 34 y.o. [id number] who was admitted on 5/16 for IOL due to prolonged pregnancy, treated with cytotec. GBS positive, received appropriate >4 hr PCN prophylaxis. Progressed to vaginal delivery within 24 hours. Delivery note below. Pt is meeting milestones, ambulating, improved pain control, decreased bleeding, tolerating ambulation and PO, +BM. Pt is currently breast and bottle feeding and [facility] Interested in Nexplanon implant. No barriers to discharge.  Delivery Note Progressed quickly to complete dilation and pushed well to SVD in short time. At 2:09 AM a viable and healthy female was delivered via Vaginal, Spontaneous Delivery (Presentation: OA with compound hand on posterior arm side ).  Posterior shoulder delivered first, but unable to sweep hand around to totally free the shoulder. Anterior shoulder delivered next, within 30 seconds.   APGAR: , ; weight .  Placenta status: Spontaneous and grossly intact with 3 vessel Cord: with the following complications: .nuchal cord x 1, loose  Anesthesia: Epidural  Episiotomy: none Lacerations: Abrasions  Suture Repair: none Est. Blood Loss (mL): 250  Mom to postpartum. Baby to Couplet care / Skin to Skin.  Wynelle BourgeoisWILLIAMS,[name] 10/11/2014, 2:25 AM  Physical Exam:  General: alert and cooperative, well-appearing, NAD Lochia: appropriate Uterine Fundus: firm,  U-2 DVT Evaluation: No evidence of DVT seen on physical exam. Negative Homan's sign. No cords or calf tenderness. No significant calf/ankle edema.  Discharge Information: Date: 10/13/2014 Activity: pelvic rest Diet: routine Medications: PNV, Ibuprofen and Colace Baby feeding: plans to breastfeed, plans to bottle feed Contraception: Nexplanon implant Condition: stable Instructions: refer to practice specific booklet Discharge to: home   Newborn Data: Live born female  Birth Weight: 8 lb 1.5 oz (3670 g) APGAR: 7, 9  Anticipated to discharge home with mother.  Saralyn PilarAlexander Karamalegos, DO [facility], PGY-2  10/13/2014, 6:13 AM   CNM attestation I have seen and examined this patient and agree with above documentation in the resident's note.   Destiny Jacobs is a 34 y.o. [id number] s/p SVD.   Pain is well controlled.  Plan for birth control is Nexplanon.  Method of Feeding: breast  PE:  BP 103/70 mmHg  Pulse 95  Temp(Src) 98.1 F (36.7 C) (Oral)  Resp 18  Ht 5\' 4"  (1.626 m)  Wt 80.287 kg (177 lb)  BMI 30.37 kg/m2  SpO2 99%  LMP 12/26/2013  Breastfeeding? Unknown Heart: RRR Lungs: nl effort Fundus firm  No results for input(s): HGB, HCT in the last 72 hours.   Plan: discharge today - postpartum care discussed - f/u clinic in 6 weeks for postpartum visit   SHAW, KIMBERLY, CNM 10:38 AM

## 2014-10-13 NOTE — Discharge Instructions (Signed)

## 2014-10-13 NOTE — Lactation Note (Signed)
This note was copied from the chart of Destiny Jacobs. Lactation Consultation Note  Mom reports that BF is going well. Breasts are filling. Hand expression reviewed and breasts somewhat softer.  Encouraged frequent BF to prevent engorgement and increase supply.  Understanding verbalized. Mom denies questions.  [employer] interpreter used. Patient Name: Destiny Jacobs BJYNW'GToday's Date: 10/13/2014     Maternal Data    Feeding    LATCH Score/Interventions                      Lactation Tools Discussed/Used     Consult Status      Soyla DryerJoseph, Vanilla [place] 10/13/2014, 12:24 PM

## 2014-11-21 ENCOUNTER — Encounter: Payer: Self-pay | Admitting: Obstetrics & Gynecology

## 2014-11-21 ENCOUNTER — Ambulatory Visit (INDEPENDENT_AMBULATORY_CARE_PROVIDER_SITE_OTHER): Payer: Self-pay | Admitting: Obstetrics & Gynecology

## 2014-11-21 DIAGNOSIS — Z3009 Encounter for other general counseling and advice on contraception: Secondary | ICD-10-CM

## 2014-11-21 NOTE — Progress Notes (Deleted)
Patient ID: Destiny Jacobs, female   DOB: 07/25/1980, 34 y.o.   MRN: [id number] Subjective:     Destiny Jacobs is a 34 y.o. female who presents for a postpartum visit. She is 6 weeks postpartum following a spontaneous vaginal delivery. I have fully reviewed the prenatal and intrapartum course. The delivery was at 41.2 gestational weeks. Outcome: spontaneous vaginal delivery. Anesthesia: epidural. Postpartum course has been unremarkable. Baby's course has been unremarkable. Baby is feeding by bottle - Similac Advance. Bleeding stopped 2 weeks after delivery and started bleeding again one week ago. Bowel function is normal. Bladder function is normal. Patient is sexually active. Contraception method is IUD or Nexplanon. Postpartum depression screening: negative.  [name] used for interpreter today  {Common ambulatory SmartLinks:19316}  Review of Systems {ros; complete:30496}   Objective:    BP 106/76 mmHg  Pulse 78  Temp(Src) 98.2 F (36.8 C) (Oral)  Ht 5' (1.524 m)  Wt 161 lb 11.2 oz (73.347 kg)  BMI 31.58 kg/m2  LMP 11/15/2014  Breastfeeding? Yes  General:  {gen appearance:16600}   Breasts:  {breast exam:1202::"inspection negative, no nipple discharge or bleeding, no masses or nodularity palpable"}  Lungs: {lung exam:16931}  Heart:  {heart exam:5510}  Abdomen: {abdomen exam:16834}   Vulva:  {labia exam:12198}  Vagina: {vagina exam:12200}  Cervix:  {cervix exam:14595}  Corpus: {uterus exam:12215}  Adnexa:  {adnexa exam:12223}  Rectal Exam: {rectal/vaginal exam:12274}        Assessment:    *** postpartum exam. Pap smear {done:10129} at today's visit.   Plan:    1. Contraception: {method:5051} 2. *** 3. Follow up in: {1-10:13787} {time; units:19136} or as needed.

## 2014-11-21 NOTE — Patient Instructions (Signed)
Return to clinic for any scheduled appointments or for any gynecologic concerns as needed.   

## 2014-11-21 NOTE — Progress Notes (Signed)
    Subjective:   [name] is a 34 y.o. [phone] female who presents for a postpartum visit. Patient is Spanish-speaking only, Spanish interpreter present for this encounter.  She is 6 weeks postpartum following a spontaneous vaginal delivery. I have fully reviewed the prenatal and intrapartum course. The delivery was at 41.2 gestational weeks. Outcome: spontaneous vaginal delivery. Anesthesia: epidural. Postpartum course has been uncomplicated. Baby's course has been uncomplicated. Baby is feeding by formula. Bleeding no bleeding. Bowel function is normal. Bladder function is normal. Patient is sexually active. Contraception method is condoms. Postpartum depression screening: negative.  The following portions of the patient's history were reviewed and updated as appropriate: allergies, current medications, past family history, past medical history, past social history, past surgical history and problem list.  Normal pap smear in 09/27/2013.  Review of Systems Pertinent items are noted in HPI.   Objective:   BP 106/76 mmHg  Pulse 78  Temp(Src) 98.2 F (36.8 C) (Oral)  Ht 5' (1.524 m)  Wt 161 lb 11.2 oz (73.347 kg)  BMI 31.58 kg/m2  LMP 11/15/2014  Breastfeeding? Yes  General:  alert and no distress   Breasts:  deferred  Lungs: clear to auscultation bilaterally  Heart:  regular rate and rhythm  Abdomen: soft, non-tender; bowel sounds normal; no masses,  no organomegaly   Pelvic:  not evaluated        Assessment:   Normal postpartum exam. Pap smear not done at today's visit.   Plan:   1. Contraception: condoms.  Counseled about LARC, can [facility] at [facility] if she desires this. 2. Follow up as needed.    Destiny CollinsUGONNA  Kainen Struckman, MD, FACOG Attending Obstetrician & Gynecologist Center for [facility], [facility]

## 2020-05-23 ENCOUNTER — Other Ambulatory Visit: Payer: Self-pay | Admitting: Obstetrics and Gynecology

## 2020-05-23 DIAGNOSIS — Z1231 Encounter for screening mammogram for malignant neoplasm of breast: Secondary | ICD-10-CM

## 2020-06-15 ENCOUNTER — Other Ambulatory Visit: Payer: Self-pay

## 2020-06-15 ENCOUNTER — Ambulatory Visit
Admission: RE | Admit: 2020-06-15 | Discharge: 2020-06-15 | Disposition: A | Discharge status: Home / Self Care | Payer: No Typology Code available for payment source | Source: Ambulatory Visit | Attending: Obstetrics and Gynecology | Admitting: Obstetrics and Gynecology

## 2020-06-15 DIAGNOSIS — Z1231 Encounter for screening mammogram for malignant neoplasm of breast: Secondary | ICD-10-CM

## 2020-09-07 ENCOUNTER — Emergency Department (HOSPITAL_COMMUNITY)
Admission: EM | Admit: 2020-09-07 | Discharge: 2020-09-07 | Disposition: A | Discharge status: Home / Self Care | Payer: No Typology Code available for payment source | Attending: Emergency Medicine | Admitting: Emergency Medicine

## 2020-09-07 ENCOUNTER — Encounter (HOSPITAL_COMMUNITY): Payer: Self-pay

## 2020-09-07 DIAGNOSIS — R112 Nausea with vomiting, unspecified: Secondary | ICD-10-CM | POA: Insufficient documentation

## 2020-09-07 DIAGNOSIS — R1013 Epigastric pain: Secondary | ICD-10-CM | POA: Insufficient documentation

## 2020-09-07 LAB — COMPREHENSIVE METABOLIC PANEL
ALT: 13 U/L (ref 0–44)
AST: 15 U/L (ref 15–41)
Albumin: 3.8 g/dL (ref 3.5–5.0)
Alkaline Phosphatase: 49 U/L (ref 38–126)
Anion gap: 8 (ref 5–15)
BUN: 9 mg/dL (ref 6–20)
CO2: 24 mmol/L (ref 22–32)
Calcium: 9 mg/dL (ref 8.9–10.3)
Chloride: 104 mmol/L (ref 98–111)
Creatinine, Ser: 0.62 mg/dL (ref 0.44–1.00)
GFR, Estimated: >60 mL/min (ref >60)
Glucose, Bld: 104 mg/dL — ABNORMAL HIGH (ref 70–99)
Potassium: 3.9 mmol/L (ref 3.5–5.1)
Sodium: 136 mmol/L (ref 135–145)
Total Bilirubin: 1.2 mg/dL (ref 0.3–1.2)
Total Protein: 6.8 g/dL (ref 6.5–8.1)

## 2020-09-07 LAB — URINALYSIS, ROUTINE W REFLEX MICROSCOPIC
Bilirubin Urine: NEGATIVE
Glucose, UA: NEGATIVE mg/dL
Hgb urine dipstick: NEGATIVE
Ketones, ur: 5 mg/dL — AB
Leukocytes,Ua: NEGATIVE
Nitrite: NEGATIVE
Protein, ur: NEGATIVE mg/dL
Specific Gravity, Urine: 1.003 — ABNORMAL LOW (ref 1.005–1.030)
pH: 6 (ref 5.0–8.0)

## 2020-09-07 LAB — LIPASE, BLOOD: Lipase: 32 U/L (ref 11–51)

## 2020-09-07 LAB — CBC
HCT: 45.5 % (ref 36.0–46.0)
Hemoglobin: 14.8 g/dL (ref 12.0–15.0)
MCH: 30.1 pg (ref 26.0–34.0)
MCHC: 32.5 g/dL (ref 30.0–36.0)
MCV: 92.7 fL (ref 80.0–100.0)
Platelets: 299 10*3/uL (ref 150–400)
RBC: 4.91 MIL/uL (ref 3.87–5.11)
RDW: 12.1 % (ref 11.5–15.5)
WBC: 9.9 10*3/uL (ref 4.0–10.5)
nRBC: 0 % (ref 0.0–0.2)

## 2020-09-07 LAB — I-STAT BETA HCG BLOOD, ED (MC, WL, AP ONLY): I-stat hCG, quantitative: <5 m[IU]/mL (ref <5)

## 2020-09-07 MED ORDER — OMEPRAZOLE 20 MG PO CPDR: 20.0000 mg | DELAYED_RELEASE_CAPSULE | Freq: Every day | ORAL | 0 refills | Status: DC

## 2020-09-07 MED ORDER — ONDANSETRON HCL 4 MG/2ML IJ SOLN
4.0000 mg | Freq: Once | INTRAMUSCULAR | Status: AC
Start: 2020-09-07 — End: 2020-09-07
  Administered 2020-09-07: 4 mg via INTRAVENOUS
  Filled 2020-09-07: qty 2

## 2020-09-07 MED ORDER — FAMOTIDINE IN NACL 20-0.9 MG/50ML-% IV SOLN
20.0000 mg | Freq: Once | INTRAVENOUS | Status: AC
  Administered 2020-09-07: 20 mg via INTRAVENOUS
  Filled 2020-09-07: qty 50

## 2020-09-07 MED ORDER — SODIUM CHLORIDE 0.9 % IV BOLUS
500.0000 mL | Freq: Once | INTRAVENOUS | Status: AC
  Administered 2020-09-07: 500 mL via INTRAVENOUS

## 2020-09-07 MED ORDER — ONDANSETRON HCL 4 MG PO TABS: 4.0000 mg | ORAL_TABLET | Freq: Three times a day (TID) | ORAL | 0 refills | Status: DC | PRN

## 2020-09-07 NOTE — ED Triage Notes (Signed)
Interpreter #[id number]-Started 2 weeks ago mid/epigastric abdomen, some nausea, feels like cramping, is intermittent. With eating or with heavy movement.

## 2020-09-07 NOTE — ED Provider Notes (Signed)
[facility] EMERGENCY DEPARTMENT Provider Note   CSN: [id number] Arrival date & time: 09/07/20  4098     History Chief Complaint  Patient presents with  . Abdominal Pain    Destiny Jacobs is a 40 y.o. female.  HPI   40 year old female with no admitted past medical history presents the emergency department with concern for mid abdominal pain associated with nausea and episodes of vomiting.  Patient primarily Spanish-speaking, interpreter services used.  Patient states this has been going off and on for the past 2 weeks.  Became more severe the last 2 days.  She has had intermittent episodes of nonbloody emesis.  She states the pain is cramping, does not radiate anywhere.  Denies any diarrhea or genitourinary symptoms.  No fever.  No history of issues with her gallbladder, no previous surgeries in the abdomen.  Past Medical History:  Diagnosis Date  . Medical history non-contributory     There are no problems to display for this patient.   Past Surgical History:  Procedure Laterality Date  . NO PAST SURGERIES       OB History    Gravida  6   Para  5   Term  5   Preterm      AB  1   Living  5     SAB  1   IAB      Ectopic      Multiple  0   Live Births  5           Family History  Problem Relation Age of Onset  . Diabetes Father     Social History   Tobacco Use  . Smoking status: Never Smoker  Substance Use Topics  . Alcohol use: No  . Drug use: No    Home Medications Prior to Admission medications   Not on File    Allergies    Patient has no known allergies.  Review of Systems   Review of Systems  Constitutional: Positive for appetite change. Negative for chills and fever.  HENT: Negative for congestion.   Eyes: Negative for visual disturbance.  Respiratory: Negative for shortness of breath.   Cardiovascular: Negative for chest pain.  Gastrointestinal: Positive for abdominal pain, nausea and vomiting.  Negative for diarrhea.  Genitourinary: Negative for dysuria, flank pain and hematuria.  Skin: Negative for rash.  Neurological: Negative for headaches.    Physical Exam Updated Vital Signs BP 101/84 (BP Location: Right Arm)   Pulse 87   Temp 98 F (36.7 C) (Oral)   Resp 18   Ht 5\' 6"  (1.676 m)   Wt 68 kg   SpO2 100%   BMI 24.21 kg/m   Physical Exam Vitals and nursing note reviewed.  Constitutional:      Appearance: Normal appearance.  HENT:     Head: Normocephalic.     Mouth/Throat:     Mouth: Mucous membranes are moist.  Cardiovascular:     Rate and Rhythm: Normal rate.  Pulmonary:     Effort: Pulmonary effort is normal. No respiratory distress.  Abdominal:     Palpations: Abdomen is soft.     Tenderness: There is abdominal tenderness in the epigastric area. There is no guarding. Negative signs include Murphy's sign.     Comments: Patient admits to mild epigastric abdominal pain with palpation but is nonreactive on exam, no guarding  Skin:    General: Skin is warm.  Neurological:     Mental Status:  She is alert and oriented to person, place, and time. Mental status is at baseline.  Psychiatric:        Mood and Affect: Mood normal.     ED Results / Procedures / Treatments   Labs (all labs ordered are listed, but only abnormal results are displayed) Labs Reviewed  CBC  LIPASE, BLOOD  COMPREHENSIVE METABOLIC PANEL  URINALYSIS, ROUTINE W REFLEX MICROSCOPIC  I-STAT BETA HCG BLOOD, ED ([facility], [facility], [facility] ONLY)    EKG None  Radiology No results found.  Procedures Procedures   Medications Ordered in ED Medications  sodium chloride 0.9 % bolus 500 mL (has no administration in time range)  ondansetron (ZOFRAN) injection 4 mg (has no administration in time range)  famotidine (PEPCID) IVPB 20 mg premix (has no administration in time range)    ED Course  I have reviewed the triage vital signs and the nursing notes.  Pertinent labs & imaging results that were  available during my care of the patient were reviewed by me and considered in my medical decision making (see chart for details).    MDM Rules/Calculators/A&P                          40 year old Spanish-speaking female presents the emergency department with concern for epigastric abdominal pain and nausea/vomiting.  Vital signs are stable on arrival, patient states that she has mild epigastric abdominal pain with palpation but is nonreactive on exam.  Plan for lab evaluation and symptomatic treatment.  Blood work is reassuring, no acute abnormalities.  Urinalysis is unremarkable, pregnancy is negative.  Low suspicion for gallbladder disease with a negative Murphy sign.  On reevaluation patient's symptoms have resolved, she is been able to p.o.  Spoke with the patient with interpreter and to her husband and described the treatment plan.  Will patient will be placed on a daily antiacid along with Zofran medication.  Instructed her to follow-up with primary doctor and possibly GI.  Patient will be discharged and treated as an outpatient.  Discharge plan and strict return to ED precautions discussed, patient verbalizes understanding and agreement.  Final Clinical Impression(s) / ED Diagnoses Final diagnoses:  None    Rx / DC Orders ED Discharge Orders    None       Rozelle Logan, DO 09/07/20 1111

## 2020-09-07 NOTE — Discharge Instructions (Addendum)
You have been seen and discharged from the emergency department.  Take new medications as prescribed.  Follow-up with your primary provider for reevaluation and further care.  If your symptoms do not get better you may need to be seen by a gastroenterologist, a special GI doctor who can look closer at the stomach.  Eat a bland diet, stay well-hydrated.  If you have any worsening symptoms or further concerns for health please return to an emergency department for further evaluation.

## 2021-07-29 IMAGING — MG MM DIGITAL SCREENING BILAT W/ TOMO AND CAD
6 of 10 series · 6 of 30 positions shown · non-contrast
Comparison: None.

CLINICAL DATA: Screening.

EXAM:
DIGITAL SCREENING BILATERAL MAMMOGRAM WITH TOMO AND CAD

[L CC synth-2D]
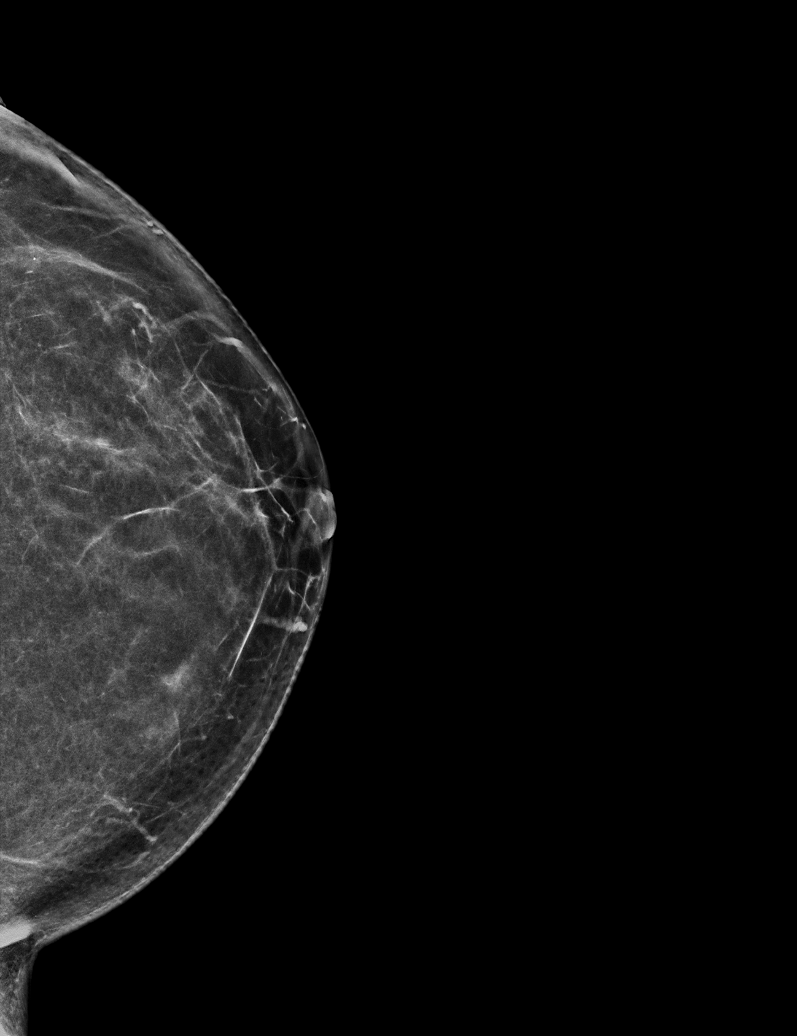

[R MLO synth-2D (1 of 2)]
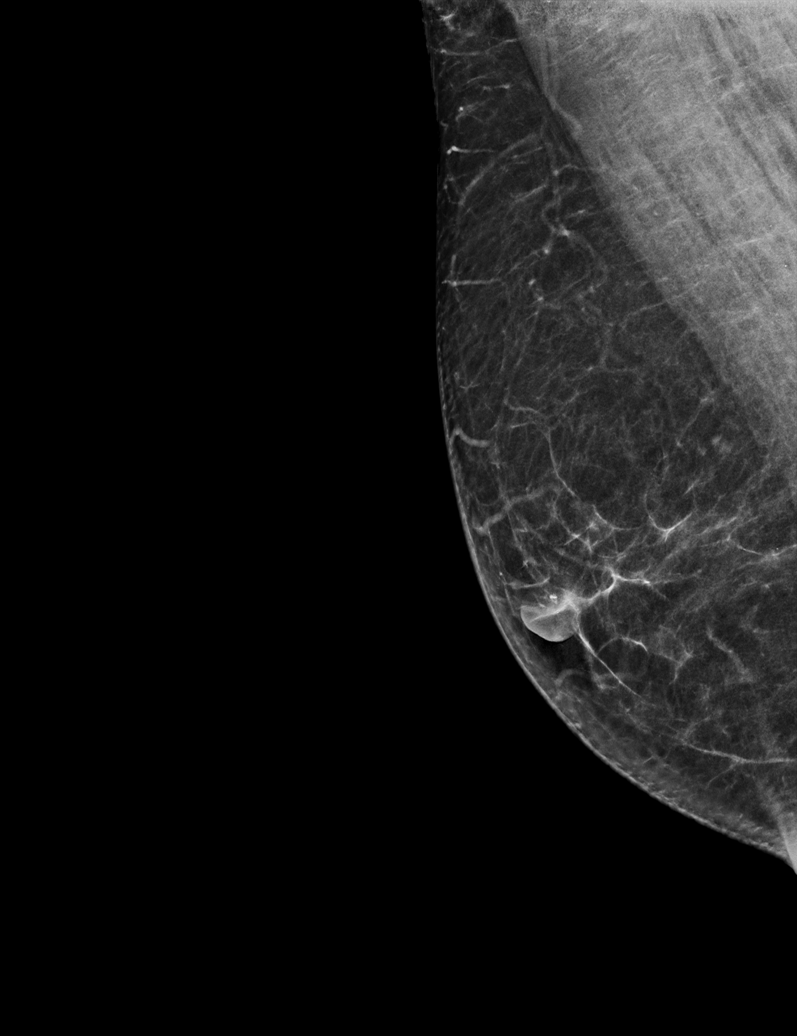

[R CC synth-2D]
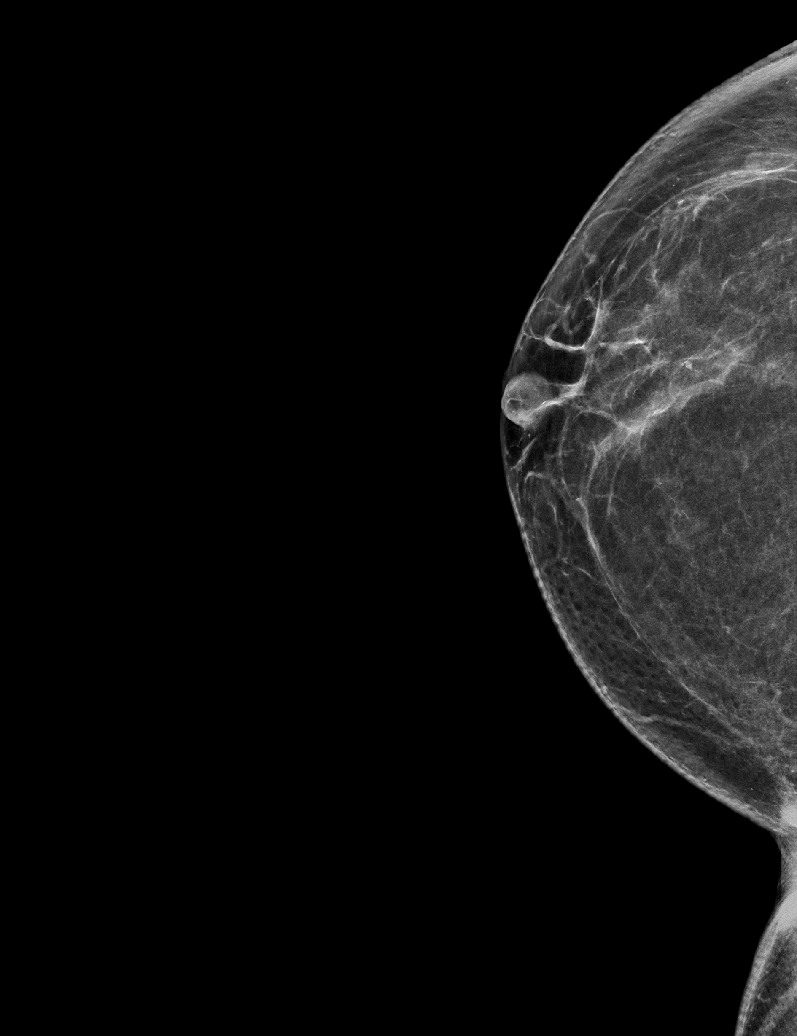

[L MLO synth-2D]
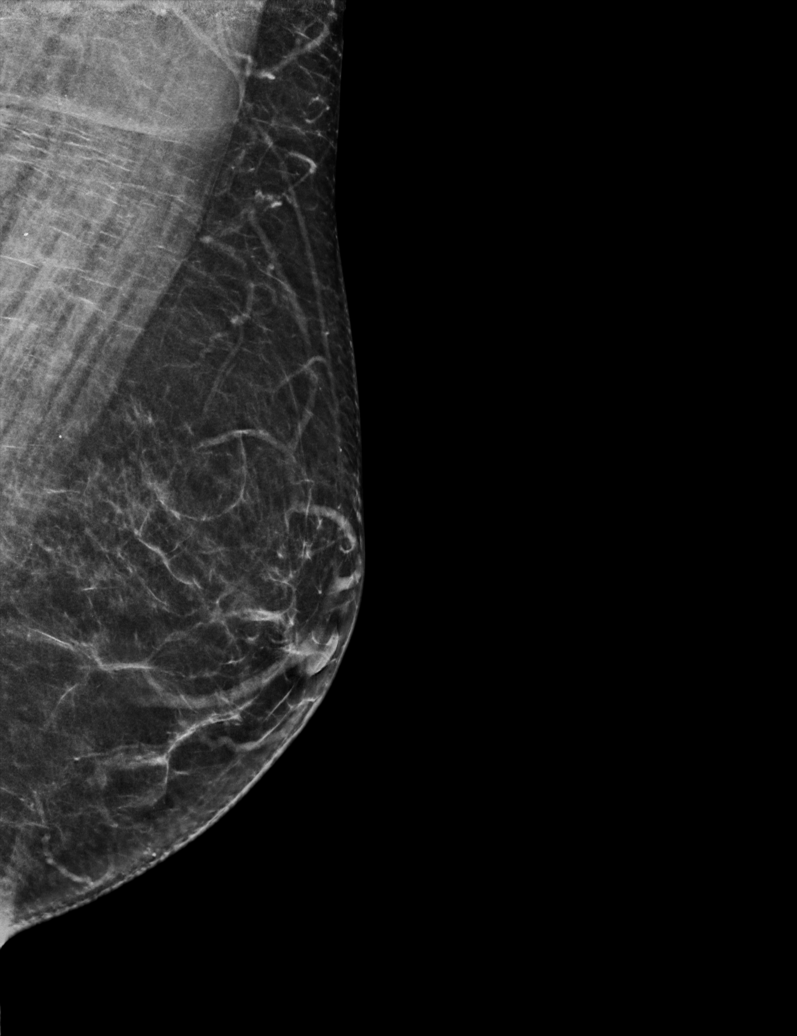

[R MLO synth-2D (2 of 2)]
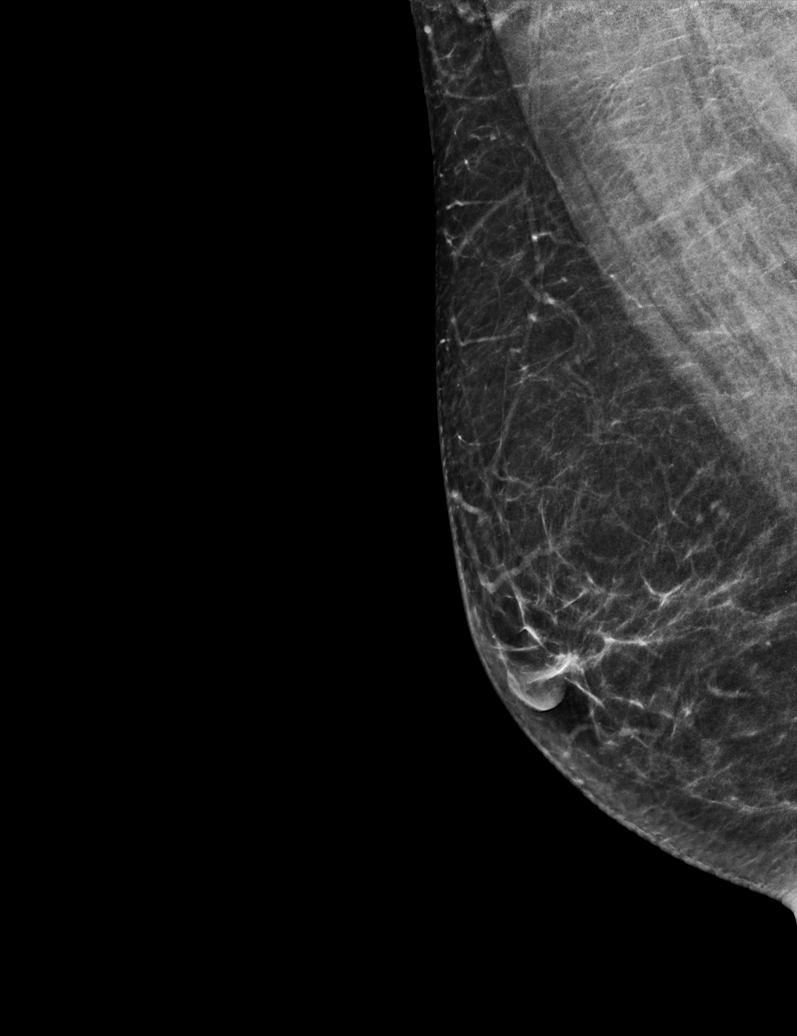

[R MLO tomo · tomo slice 31/62.0]
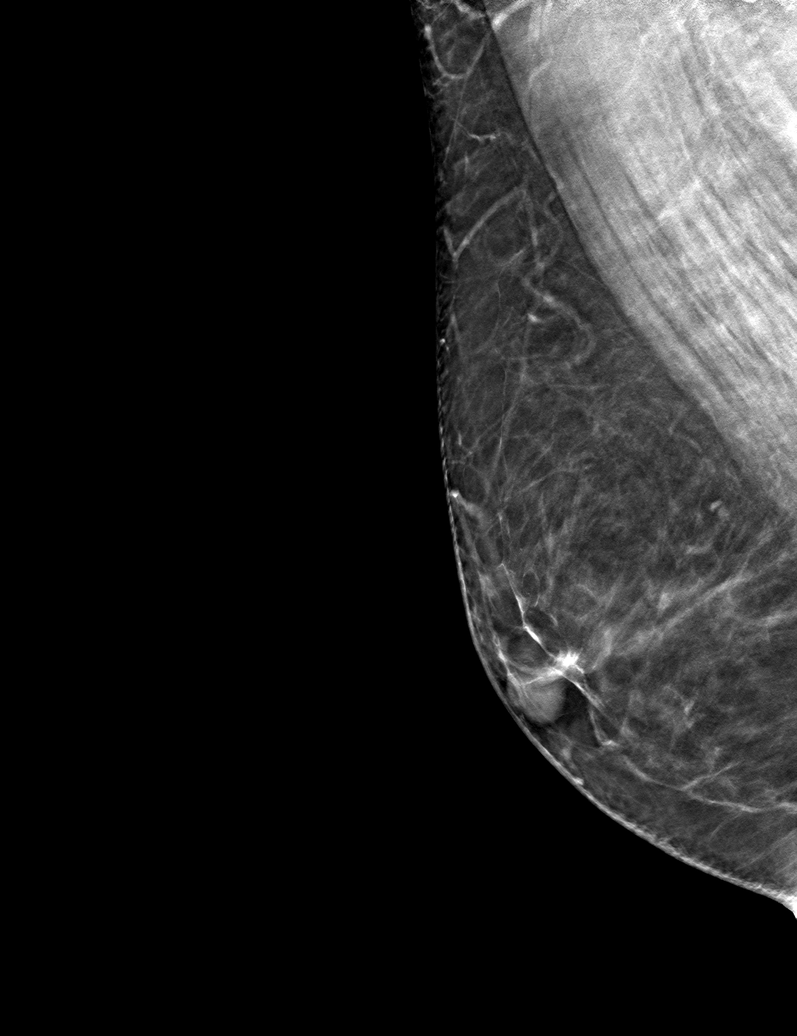

[6 of 30 positions shown; findings below may reference images not displayed]

ACR Breast Density Category b: There are scattered areas of
fibroglandular density.
FINDINGS: There are no findings suspicious for malignancy. Images were
processed with CAD.
IMPRESSION: No mammographic evidence of malignancy. A result letter of this
screening mammogram will be mailed directly to the patient.

RECOMMENDATION:
Screening mammogram in one year. (Code:Y5-G-EJ6)

BI-RADS CATEGORY  1: Negative.

## 2021-12-05 ENCOUNTER — Other Ambulatory Visit: Payer: Self-pay | Admitting: Obstetrics & Gynecology

## 2021-12-05 DIAGNOSIS — Z1231 Encounter for screening mammogram for malignant neoplasm of breast: Secondary | ICD-10-CM

## 2021-12-27 ENCOUNTER — Ambulatory Visit
Admission: RE | Admit: 2021-12-27 | Discharge: 2021-12-27 | Disposition: A | Discharge status: Home / Self Care | Payer: No Typology Code available for payment source | Source: Ambulatory Visit | Attending: Obstetrics & Gynecology | Admitting: Obstetrics & Gynecology

## 2021-12-27 DIAGNOSIS — Z1231 Encounter for screening mammogram for malignant neoplasm of breast: Secondary | ICD-10-CM

## 2022-05-07 ENCOUNTER — Emergency Department (HOSPITAL_COMMUNITY): Payer: No Typology Code available for payment source

## 2022-05-07 ENCOUNTER — Encounter (HOSPITAL_COMMUNITY): Payer: Self-pay | Admitting: Emergency Medicine

## 2022-05-07 ENCOUNTER — Emergency Department (HOSPITAL_COMMUNITY)
Admission: EM | Admit: 2022-05-07 | Discharge: 2022-05-07 | Disposition: A | Discharge status: Home / Self Care | Payer: No Typology Code available for payment source | Attending: Emergency Medicine | Admitting: Emergency Medicine

## 2022-05-07 DIAGNOSIS — K529 Noninfective gastroenteritis and colitis, unspecified: Secondary | ICD-10-CM | POA: Insufficient documentation

## 2022-05-07 DIAGNOSIS — Z1152 Encounter for screening for COVID-19: Secondary | ICD-10-CM | POA: Insufficient documentation

## 2022-05-07 DIAGNOSIS — R112 Nausea with vomiting, unspecified: Secondary | ICD-10-CM | POA: Insufficient documentation

## 2022-05-07 LAB — RESP PANEL BY RT-PCR (FLU A&B, COVID) ARPGX2
Influenza A by PCR: NEGATIVE
Influenza B by PCR: NEGATIVE
SARS Coronavirus 2 by RT PCR: NEGATIVE

## 2022-05-07 LAB — CBC WITH DIFFERENTIAL/PLATELET
Abs Immature Granulocytes: 0.07 10*3/uL (ref 0.00–0.07)
Basophils Absolute: 0.1 10*3/uL (ref 0.0–0.1)
Basophils Relative: 0 %
Eosinophils Absolute: 2.7 10*3/uL — ABNORMAL HIGH (ref 0.0–0.5)
Eosinophils Relative: 19 %
HCT: 41.5 % (ref 36.0–46.0)
Hemoglobin: 14.2 g/dL (ref 12.0–15.0)
Immature Granulocytes: 1 %
Lymphocytes Relative: 15 %
Lymphs Abs: 2.1 10*3/uL (ref 0.7–4.0)
MCH: 31.6 pg (ref 26.0–34.0)
MCHC: 34.2 g/dL (ref 30.0–36.0)
MCV: 92.4 fL (ref 80.0–100.0)
Monocytes Absolute: 0.5 10*3/uL (ref 0.1–1.0)
Monocytes Relative: 4 %
Neutro Abs: 8.6 10*3/uL — ABNORMAL HIGH (ref 1.7–7.7)
Neutrophils Relative %: 61 %
Platelets: 287 10*3/uL (ref 150–400)
RBC: 4.49 MIL/uL (ref 3.87–5.11)
RDW: 12.3 % (ref 11.5–15.5)
Smear Review: NORMAL
WBC: 14.1 10*3/uL — ABNORMAL HIGH (ref 4.0–10.5)
nRBC: 0 % (ref 0.0–0.2)

## 2022-05-07 LAB — COMPREHENSIVE METABOLIC PANEL
ALT: 13 U/L (ref 0–44)
AST: 15 U/L (ref 15–41)
Albumin: 3.1 g/dL — ABNORMAL LOW (ref 3.5–5.0)
Alkaline Phosphatase: 41 U/L (ref 38–126)
Anion gap: 9 (ref 5–15)
BUN: 11 mg/dL (ref 6–20)
CO2: 22 mmol/L (ref 22–32)
Calcium: 8.6 mg/dL — ABNORMAL LOW (ref 8.9–10.3)
Chloride: 105 mmol/L (ref 98–111)
Creatinine, Ser: 0.62 mg/dL (ref 0.44–1.00)
GFR, Estimated: >60 mL/min (ref >60)
Glucose, Bld: 121 mg/dL — ABNORMAL HIGH (ref 70–99)
Potassium: 3.9 mmol/L (ref 3.5–5.1)
Sodium: 136 mmol/L (ref 135–145)
Total Bilirubin: 0.4 mg/dL (ref 0.3–1.2)
Total Protein: 6.1 g/dL — ABNORMAL LOW (ref 6.5–8.1)

## 2022-05-07 LAB — URINALYSIS, ROUTINE W REFLEX MICROSCOPIC
Bilirubin Urine: NEGATIVE
Glucose, UA: NEGATIVE mg/dL
Hgb urine dipstick: NEGATIVE
Ketones, ur: NEGATIVE mg/dL
Leukocytes,Ua: NEGATIVE
Nitrite: NEGATIVE
Protein, ur: NEGATIVE mg/dL
Specific Gravity, Urine: 1.016 (ref 1.005–1.030)
pH: 5 (ref 5.0–8.0)

## 2022-05-07 LAB — PREGNANCY, URINE: Preg Test, Ur: NEGATIVE

## 2022-05-07 LAB — LIPASE, BLOOD: Lipase: 35 U/L (ref 11–51)

## 2022-05-07 MED ORDER — OXYCODONE-ACETAMINOPHEN 5-325 MG PO TABS
1.0000 | ORAL_TABLET | Freq: Once | ORAL | Status: AC
  Administered 2022-05-07: 1 via ORAL
  Filled 2022-05-07: qty 1

## 2022-05-07 MED ORDER — ACETAMINOPHEN 325 MG PO TABS
650.0000 mg | ORAL_TABLET | Freq: Once | ORAL | Status: AC
Start: 2022-05-07 — End: 2022-05-07
  Administered 2022-05-07: 650 mg via ORAL
  Filled 2022-05-07: qty 2

## 2022-05-07 MED ORDER — ONDANSETRON 4 MG PO TBDP
4.0000 mg | ORAL_TABLET | Freq: Once | ORAL | Status: AC
  Administered 2022-05-07: 4 mg via ORAL
  Filled 2022-05-07: qty 1

## 2022-05-07 MED ORDER — DICYCLOMINE HCL 10 MG PO CAPS: 10.0000 mg | ORAL_CAPSULE | Freq: Once | ORAL | Status: DC

## 2022-05-07 MED ORDER — IOHEXOL 350 MG/ML SOLN
75.0000 mL | Freq: Once | INTRAVENOUS | Status: AC | PRN
  Administered 2022-05-07: 75 mL via INTRAVENOUS

## 2022-05-07 MED ORDER — ONDANSETRON HCL 4 MG PO TABS: 4.0000 mg | ORAL_TABLET | Freq: Four times a day (QID) | ORAL | 0 refills | Status: DC

## 2022-05-07 NOTE — ED Provider Triage Note (Signed)
Emergency Medicine Provider Triage Evaluation Note  Destiny Jacobs , a 41 y.o. female  was evaluated in triage.  Pt complains of central abdominal pain with nausea and diarrhea that started this evening.  Subjective fevers and chills, no known ill contacts.  Review of Systems  Positive: As above Negative: As above  Physical Exam  BP 101/77 (BP Location: Right Arm)   Pulse 97   Temp 98.2 F (36.8 C)   Resp 18   LMP 04/19/2022   SpO2 100%  Gen:   Awake, no distress   Resp:  Normal effort  MSK:   Moves extremities without difficulty  Other:  Periumbilical and left lower quadrant tenderness palpation without rebound or guarding  Medical Decision Making  Medically screening exam initiated at 2:18 AM.  Appropriate orders placed.  Geronimo Boot was informed that the remainder of the evaluation will be completed by another provider, this initial triage assessment does not replace that evaluation, and the importance of remaining in the ED until their evaluation is complete.  This chart was dictated using voice recognition software, Dragon. Despite the best efforts of this provider to proofread and correct errors, errors may still occur which can change documentation meaning.    Paris Lore, PA-C 05/07/22 [phone]

## 2022-05-07 NOTE — Discharge Instructions (Addendum)
Gracias por venir al [employer] [facility] [place]. Lo atendieron por dolor abdominal, nuseas/vmitos. Hicimos un examen, laboratorios e imgenes, y mostraron colitis y gastroenteritis, que es una inflamacin en el intestino y el colon que causa nuseas y [place]. Lo ms probable es que esto sea de origen viral, que se curar por s solo. Le hemos recetado zofran para las nuseas, que puede tomar cada 6 a 8 horas segn sea necesario para las nuseas. Puede tomar 1000 mg de tylenol (acetaminofn) cada 8 horas.  Haga un seguimiento con su proveedor de atencin primaria dentro de 1 a 2 semanas. tambin para que vuelvan a comprobar sus laboratorios. Le proporcionamos el [employer] [facility] al que puede llamar para establecer una cita con un mdico de atencin primaria.  No dude en regresar al servicio de urgencias o llamar al 911 si experimenta: -Empeoramiento de los sntomas -Incapacidad para comer o beber. -Empeoramiento de la diarrea acuosa. -Aturdimiento, desmayo. -Fiebre/escalofros -Cualquier otra cosa que te preocupe

## 2022-05-07 NOTE — ED Provider Notes (Signed)
[facility] EMERGENCY DEPARTMENT Provider Note   CSN: [id number] Arrival date & time: 05/07/22  0054     History  Chief Complaint  Patient presents with   Abdominal Pain    Destiny Jacobs is a 41 y.o. female with no significant PMH presents with abdominal pain, nausea, diarrhea.   Patient reports of acute onset moderate to severe intermittent central abdominal pain with nausea and diarrhea that started yesterday evening. No vomiting. No h/o similar. Denies f/c or known ill contacts. Has only had 2 episodes of loose diarrhea, no melena/hematochezia. Denies CP, SOB, sore throat, body aches, urinary or vaginal symptoms, lower extremity edema. No recent travel or antibiotics. States that she has no pain anymore since waiting in the waiting room and feels much better now.    Abdominal Pain      Home Medications Prior to Admission medications   Medication Sig Start Date End Date Taking? Authorizing Provider  ondansetron (ZOFRAN) 4 MG tablet Take 1 tablet (4 mg total) by mouth every 6 (six) hours. 05/07/22  Yes Loetta Rough, MD  omeprazole (PRILOSEC) 20 MG capsule Take 1 capsule (20 mg total) by mouth daily. 09/07/20   Horton, Clabe Seal, DO      Allergies    Patient has no known allergies.    Review of Systems   Review of Systems  Gastrointestinal:  Positive for abdominal pain.   Review of systems Negative for f/c.  A 10 point review of systems was performed and is negative unless otherwise reported in HPI.  Physical Exam Updated Vital Signs BP 100/73   Pulse 90   Temp 98 F (36.7 C) (Oral)   Resp 14   LMP 04/19/2022   SpO2 100%  Physical Exam General: Normal appearing female, lying in bed.  HEENT: Sclera anicteric, MMM, trachea midline.  Cardiology: RRR, no murmurs/rubs/gallops.  Resp: Normal respiratory rate and effort. CTAB, no wheezes, rhonchi, crackles.  Abd: Soft, non-tender, non-distended. No rebound tenderness or guarding.  GU:  Deferred. MSK: No peripheral edema or signs of trauma. Extremities without deformity. No cyanosis or clubbing. Skin: warm, dry. No rashes or lesions Neuro: A&Ox4, CNs II-XII grossly intact. MAEs. Sensation grossly intact.  Psych: Normal mood and affect.   ED Results / Procedures / Treatments   Labs (all labs ordered are listed, but only abnormal results are displayed) Labs Reviewed  CBC WITH DIFFERENTIAL/PLATELET - Abnormal; Notable for the following components:      Result Value   WBC 14.1 (*)    Neutro Abs 8.6 (*)    Eosinophils Absolute 2.7 (*)    All other components within normal limits  COMPREHENSIVE METABOLIC PANEL - Abnormal; Notable for the following components:   Glucose, Bld 121 (*)    Calcium 8.6 (*)    Total Protein 6.1 (*)    Albumin 3.1 (*)    All other components within normal limits  RESP PANEL BY RT-PCR (FLU A&B, COVID) ARPGX2  LIPASE, BLOOD  URINALYSIS, ROUTINE W REFLEX MICROSCOPIC  PREGNANCY, URINE    Radiology CT ABDOMEN PELVIS W CONTRAST  Result Date: 05/07/2022 CLINICAL DATA:  Abdominal pain common acute, nonlocalized. EXAM: CT ABDOMEN AND PELVIS WITH CONTRAST TECHNIQUE: Multidetector CT imaging of the abdomen and pelvis was performed using the standard protocol following bolus administration of intravenous contrast. RADIATION DOSE REDUCTION: This exam was performed according to the departmental dose-optimization program which includes automated exposure control, adjustment of the mA and/or kV according to patient size and/or use  of iterative reconstruction technique. CONTRAST:  8mL OMNIPAQUE IOHEXOL 350 MG/ML SOLN COMPARISON:  None Available. FINDINGS: Lower chest: Circumferential low-density thickening of the lower esophagus. Hepatobiliary: Probable hepatic steatosis even when accounting for postcontrast appearance.no evidence of biliary obstruction or stone. Pancreas: Unremarkable. Spleen: Unremarkable. Adrenals/Urinary Tract: Negative adrenals. No  hydronephrosis or stone. Simple cyst in the lower pole left kidney measuring 16 mm, no follow-up recommended. Perivesicular fat stranding. Stomach/Bowel: Low-density colonic wall thickening diffusely, sparing the proximal and distal colon. Major mesenteric vessels are enhancing. Proximal small bowel also appears thickened with mesenteric edema, centered on the ligament of Treitz region. No bowel obstruction. No appendicitis. Vascular/Lymphatic: No acute vascular abnormality. No mass or adenopathy. Reproductive:No pathologic findings. Other: Small volume pelvic ascites which is likely reactive. Musculoskeletal: No acute abnormalities. IMPRESSION: Colitis and proximal enteritis. Electronically Signed   By: Tiburcio Pea M.D.   On: 05/07/2022 07:41    Procedures Procedures    Medications Ordered in ED Medications  ondansetron (ZOFRAN-ODT) disintegrating tablet 4 mg (4 mg Oral Given 05/07/22 0252)  acetaminophen (TYLENOL) tablet 650 mg (650 mg Oral Given 05/07/22 0252)  oxyCODONE-acetaminophen (PERCOCET/ROXICET) 5-325 MG per tablet 1 tablet (1 tablet Oral Given 05/07/22 0542)  iohexol (OMNIPAQUE) 350 MG/ML injection 75 mL (75 mLs Intravenous Contrast Given 05/07/22 0732)    ED Course/ Medical Decision Making/ A&P                          Medical Decision Making Amount and/or Complexity of Data Reviewed Labs:  Decision-making details documented in ED Course. Radiology:  Decision-making details documented in ED Course.  Risk Prescription drug management.    This patient presents to the ED for concern of abdominal pain, nausea/diarrhea, this involves an extensive number of treatment options, and is a complaint that carries with it a high risk of complications and morbidity.  I considered the following differential and admission for this acute, potentially life threatening condition.    MDM:    Consider viral infection such as flu/covid, pregnancy, pancreatitis, gastritis/GERD,  gastroenteritis/colitis, cholelithiasis/cholecystitis, diverticulitis, appendicitis. Lower c/f bacterial diarrhea or ETEC given lack of fever, patient is non-toxic, no rectal bleeding. Will obtain labs and CT abd pelvis. Patient is overall HDS and nontoxic appearing at this time.    Clinical Course as of 05/07/22 0849  Tue May 07, 2022  0714 WBC(!): 14.1 [HN]  0714 Preg Test, Ur: NEGATIVE [HN]  0714 Resp Panel by RT-PCR (Flu A&B, Covid) Anterior Nasal Swab Neg [HN]  0714 Urinalysis, Routine w reflex microscopic Urine, Clean Catch Neg [HN]  0715 Eosinophils Absolute(!): 2.7 [HN]  0715 NEUT#(!): 8.6 [HN]  0715 Lipase: 35 [HN]  0815 CT ABDOMEN PELVIS W CONTRAST Colitis and proximal enteritis. [HN]    Clinical Course User Index [HN] Loetta Rough, MD    Eosinophilia raises concern for allergic, viral, hypersensitivities, helminth, or autoimmune disorders. In this patient who has not traveled, lower c/f traveler's diarrhea. Giardia, cryptosporidium do not present with eosinophilia. With only two episodes of diarrhea that only started yesterday, do not believe patient requires stool studies or c diff at this time.   Patient has no abdominal pain at this time and is very well-appearing, HDS. Imaging demonstrated colitis/proximal enteritis. In this well-appearing patient without rectal bleeding, fever, or toxic appearance, this is likely viral in etiology. I discussed with patient and her partner at bedside about her findings. Patient does not have a PCP and so is referred to health  connect to establish and is encouraged to f/u with PCP in 1-2 weeks and have labs rechecked. Given zofran prescription and encouraged to stay well-hydrated. They both report understanding, all questions answered to their satisfaction.   Labs: I Ordered, and personally interpreted labs.  The pertinent results include:  those listed above  Imaging Studies ordered: I ordered imaging studies including CT abd pelvis I  independently visualized and interpreted imaging. I agree with the radiologist interpretation  Additional history obtained from patient, and partner at bedside.   Cardiac Monitoring: The patient was maintained on a cardiac monitor.  I personally viewed and interpreted the cardiac monitored which showed an underlying rhythm of: NSR  Reevaluation: After the interventions noted above, I reevaluated the patient and found that they have :resolved  Social Determinants of Health: Patient lives independently, does not have a PCP   Disposition:  DC w/ discharge instructions, return precautions, zofran rx    Co morbidities that complicate the patient evaluation  Past Medical History:  Diagnosis Date   Medical history non-contributory      Medicines Meds ordered this encounter  Medications   ondansetron (ZOFRAN-ODT) disintegrating tablet 4 mg   acetaminophen (TYLENOL) tablet 650 mg   DISCONTD: dicyclomine (BENTYL) capsule 10 mg   oxyCODONE-acetaminophen (PERCOCET/ROXICET) 5-325 MG per tablet 1 tablet   iohexol (OMNIPAQUE) 350 MG/ML injection 75 mL   ondansetron (ZOFRAN) 4 MG tablet    Sig: Take 1 tablet (4 mg total) by mouth every 6 (six) hours.    Dispense:  12 tablet    Refill:  0    I have reviewed the patients home medicines and have made adjustments as needed  Problem List / ED Course: Problem List Items Addressed This Visit   None Visit Diagnoses     Nausea vomiting and diarrhea    -  Primary   Gastroenteritis       Colitis                   This note was created using dictation software, which may contain spelling or grammatical errors.    Loetta Rough, MD 05/07/22 [phone]

## 2022-05-07 NOTE — ED Triage Notes (Signed)
Belly pain generalized starting a few hours ago. Endorses nausea and diarrhea but has not vomited. Nothing makes pain better or worse. States stomach is growling and making lots of noises.

## 2022-05-10 ENCOUNTER — Emergency Department (HOSPITAL_BASED_OUTPATIENT_CLINIC_OR_DEPARTMENT_OTHER): Payer: No Typology Code available for payment source

## 2022-05-10 ENCOUNTER — Encounter (HOSPITAL_BASED_OUTPATIENT_CLINIC_OR_DEPARTMENT_OTHER): Payer: Self-pay

## 2022-05-10 ENCOUNTER — Emergency Department (HOSPITAL_BASED_OUTPATIENT_CLINIC_OR_DEPARTMENT_OTHER)
Admission: EM | Admit: 2022-05-10 | Discharge: 2022-05-10 | Disposition: A | Discharge status: Home / Self Care | Payer: No Typology Code available for payment source | Attending: Emergency Medicine | Admitting: Emergency Medicine

## 2022-05-10 ENCOUNTER — Other Ambulatory Visit: Payer: Self-pay

## 2022-05-10 DIAGNOSIS — Z20822 Contact with and (suspected) exposure to covid-19: Secondary | ICD-10-CM | POA: Insufficient documentation

## 2022-05-10 DIAGNOSIS — R112 Nausea with vomiting, unspecified: Secondary | ICD-10-CM | POA: Insufficient documentation

## 2022-05-10 DIAGNOSIS — R1013 Epigastric pain: Secondary | ICD-10-CM | POA: Insufficient documentation

## 2022-05-10 LAB — COMPREHENSIVE METABOLIC PANEL
ALT: 16 U/L (ref 0–44)
AST: 19 U/L (ref 15–41)
Albumin: 3.4 g/dL — ABNORMAL LOW (ref 3.5–5.0)
Alkaline Phosphatase: 43 U/L (ref 38–126)
Anion gap: 9 (ref 5–15)
BUN: 14 mg/dL (ref 6–20)
CO2: 22 mmol/L (ref 22–32)
Calcium: 8.7 mg/dL — ABNORMAL LOW (ref 8.9–10.3)
Chloride: 105 mmol/L (ref 98–111)
Creatinine, Ser: 0.6 mg/dL (ref 0.44–1.00)
GFR, Estimated: >60 mL/min (ref >60)
Glucose, Bld: 121 mg/dL — ABNORMAL HIGH (ref 70–99)
Potassium: 3.8 mmol/L (ref 3.5–5.1)
Sodium: 136 mmol/L (ref 135–145)
Total Bilirubin: 0.4 mg/dL (ref 0.3–1.2)
Total Protein: 6.8 g/dL (ref 6.5–8.1)

## 2022-05-10 LAB — LIPASE, BLOOD: Lipase: 34 U/L (ref 11–51)

## 2022-05-10 LAB — CBC
HCT: 39.8 % (ref 36.0–46.0)
Hemoglobin: 13.1 g/dL (ref 12.0–15.0)
MCH: 30.8 pg (ref 26.0–34.0)
MCHC: 32.9 g/dL (ref 30.0–36.0)
MCV: 93.6 fL (ref 80.0–100.0)
Platelets: 310 10*3/uL (ref 150–400)
RBC: 4.25 MIL/uL (ref 3.87–5.11)
RDW: 12.3 % (ref 11.5–15.5)
WBC: 15.9 10*3/uL — ABNORMAL HIGH (ref 4.0–10.5)
nRBC: 0 % (ref 0.0–0.2)

## 2022-05-10 LAB — URINALYSIS, ROUTINE W REFLEX MICROSCOPIC
Bilirubin Urine: NEGATIVE
Glucose, UA: NEGATIVE mg/dL
Hgb urine dipstick: NEGATIVE
Ketones, ur: NEGATIVE mg/dL
Leukocytes,Ua: NEGATIVE
Nitrite: NEGATIVE
Protein, ur: NEGATIVE mg/dL
Specific Gravity, Urine: 1.02 (ref 1.005–1.030)
pH: 7 (ref 5.0–8.0)

## 2022-05-10 LAB — PREGNANCY, URINE: Preg Test, Ur: NEGATIVE

## 2022-05-10 LAB — RESP PANEL BY RT-PCR (RSV, FLU A&B, COVID)  RVPGX2
Influenza A by PCR: NEGATIVE
Influenza B by PCR: NEGATIVE
Resp Syncytial Virus by PCR: NEGATIVE
SARS Coronavirus 2 by RT PCR: NEGATIVE

## 2022-05-10 MED ORDER — FAMOTIDINE 20 MG PO TABS: 20.0000 mg | ORAL_TABLET | Freq: Two times a day (BID) | ORAL | 0 refills | Status: DC

## 2022-05-10 MED ORDER — SODIUM CHLORIDE 0.9 % IV BOLUS
1000.0000 mL | Freq: Once | INTRAVENOUS | Status: AC
  Administered 2022-05-10: 1000 mL via INTRAVENOUS

## 2022-05-10 MED ORDER — HYDROMORPHONE HCL 1 MG/ML IJ SOLN
1.0000 mg | Freq: Once | INTRAMUSCULAR | Status: AC
  Administered 2022-05-10: 1 mg via INTRAVENOUS
  Filled 2022-05-10: qty 1

## 2022-05-10 MED ORDER — DICYCLOMINE HCL 20 MG PO TABS: 20.0000 mg | ORAL_TABLET | Freq: Two times a day (BID) | ORAL | 0 refills | Status: DC

## 2022-05-10 MED ORDER — ONDANSETRON 4 MG PO TBDP
4.0000 mg | ORAL_TABLET | Freq: Once | ORAL | Status: AC | PRN
  Administered 2022-05-10: 4 mg via ORAL
  Filled 2022-05-10: qty 1

## 2022-05-10 NOTE — ED Provider Notes (Signed)
[facility] EMERGENCY DEPARTMENT Provider Note   CSN: [id number] Arrival date & time: 05/10/22  1658     History  Chief Complaint  Patient presents with   Abdominal Pain    Destiny Jacobs is a 41 y.o. female.   Abdominal Pain    Patient presents due to nausea and diarrhea.  She started having epigastric abdominal pain about 5 days ago which is associated initially with vomiting and diarrhea.  She was seen at [facility], ED the next day which was 05/07/2022.  She had reassuring labs, negative COVID and a CT which showed enteritis.  States that the vomiting has subsided but she is still having some episodes of diarrhea without any blood in the stool.  She is having epigastric pain which has not gotten better, does improve when she takes Tylenol but was not given any other pain medicine.  No previous abdominal surgeries, denies any vaginal eating, discharge.  May be slightly worse after she eats but she has not noticed much correlation there.  No fevers, cough, shortness of breath.  Home Medications Prior to Admission medications   Medication Sig Start Date End Date Taking? Authorizing Provider  dicyclomine (BENTYL) 20 MG tablet Take 1 tablet (20 mg total) by mouth 2 (two) times daily. 05/10/22  Yes Theron Arista, PA-C  famotidine (PEPCID) 20 MG tablet Take 1 tablet (20 mg total) by mouth 2 (two) times daily. 05/10/22  Yes Theron Arista, PA-C  omeprazole (PRILOSEC) 20 MG capsule Take 1 capsule (20 mg total) by mouth daily. 09/07/20   Horton, Clabe Seal, DO  ondansetron (ZOFRAN) 4 MG tablet Take 1 tablet (4 mg total) by mouth every 6 (six) hours. 05/07/22   Loetta Rough, MD      Allergies    Patient has no known allergies.    Review of Systems   Review of Systems  Gastrointestinal:  Positive for abdominal pain.    Physical Exam Updated Vital Signs BP 102/66   Pulse 85   Temp 98.3 F (36.8 C) (Oral)   Resp 18   Ht 5\' 6"  (1.676 m)   Wt 71.7 kg   LMP  04/19/2022   SpO2 96%   BMI 25.50 kg/m  Physical Exam Vitals and nursing note reviewed. Exam conducted with a chaperone present.  Constitutional:      Appearance: Normal appearance.  HENT:     Head: Normocephalic and atraumatic.  Eyes:     General: No scleral icterus.       Right eye: No discharge.        Left eye: No discharge.     Extraocular Movements: Extraocular movements intact.     Pupils: Pupils are equal, round, and reactive to light.  Cardiovascular:     Rate and Rhythm: Normal rate and regular rhythm.     Pulses: Normal pulses.     Heart sounds: Normal heart sounds. No murmur heard.    No friction rub. No gallop.  Pulmonary:     Effort: Pulmonary effort is normal. No respiratory distress.     Breath sounds: Normal breath sounds.  Abdominal:     General: Abdomen is flat. Bowel sounds are normal. There is no distension.     Palpations: Abdomen is soft.     Tenderness: There is abdominal tenderness in the right upper quadrant and epigastric area.     Comments: Abdomen is soft, some mild right upper quadrant abdominal tenderness.  Skin:    General: Skin is warm  and dry.     Coloration: Skin is not jaundiced.  Neurological:     Mental Status: She is alert. Mental status is at baseline.     Coordination: Coordination normal.     ED Results / Procedures / Treatments   Labs (all labs ordered are listed, but only abnormal results are displayed) Labs Reviewed  COMPREHENSIVE METABOLIC PANEL - Abnormal; Notable for the following components:      Result Value   Glucose, Bld 121 (*)    Calcium 8.7 (*)    Albumin 3.4 (*)    All other components within normal limits  CBC - Abnormal; Notable for the following components:   WBC 15.9 (*)    All other components within normal limits  RESP PANEL BY RT-PCR (RSV, FLU A&B, COVID)  RVPGX2  LIPASE, BLOOD  URINALYSIS, ROUTINE W REFLEX MICROSCOPIC  PREGNANCY, URINE    EKG None  Radiology US Abdomen Limited RUQ  (LIVER/GB)  Result Date: 05/10/2022 CLINICAL DATA:  Pain right upper quadrant of abdomen EXAM: ULTRASOUND ABDOMEN LIMITED RIGHT UPPER QUADRANT COMPARISON:  CT done on 05/07/2022 FINDINGS: Gallbladder: No gallstones or wall thickening visualized. No sonographic Murphy sign noted by sonographer. Common bile duct: Diameter: 3.5 mm Liver: There is increased echogenicity in liver. No focal abnormalities are seen. Portal vein is patent on color Doppler imaging with normal direction of blood flow towards the liver. Other: None. IMPRESSION: Fatty infiltration. Right upper quadrant sonogram is otherwise unremarkable. Electronically Signed   By: Ernie Avena M.D.   On: 05/10/2022 19:15    Procedures Procedures    Medications Ordered in ED Medications  ondansetron (ZOFRAN-ODT) disintegrating tablet 4 mg (4 mg Oral Given 05/10/22 1718)  sodium chloride 0.9 % bolus 1,000 mL (0 mLs Intravenous Stopped 05/10/22 2029)  HYDROmorphone (DILAUDID) injection 1 mg (1 mg Intravenous Given 05/10/22 1834)    ED Course/ Medical Decision Making/ A&P                           Medical Decision Making Amount and/or Complexity of Data Reviewed Labs: ordered. Radiology: ordered.  Risk Prescription drug management.   Patient presents due to epigastric abdominal pain, nausea, vomiting and diarrhea.  Differential includes but not limited to dehydration, AKI, metabolic abnormality, pancreatitis, gastritis, perforated ulcer, atypical ACS, ectopic pregnancy, UTI, pyelonephritis, cholecystitis, biliary colic, SBO.  Patient has been is at bedside providing dependent history.  Also with external medical records including previous ED visit.  Patient was seen 05/07/2022, CT abdomen and pelvis with contrast was ordered at that time which was notable for proximal enteritis and colitis, no other findings.  I ordered, viewed and interpreted laboratory workup. CBC shows a white count of 15 which is roughly what it was 4  days ago. CMP without gross electrode derangement, AKI or transaminitis.  Slightly decreased calcium at 8.7, mildly hyperglycemic 121. Lipase within normal limits, consistent with new onset pancreatitis. UA is negative, for any signs of UTI, no ketonuria or proteinuria either which is reassuring. Not pregnant  I ordered and reviewed right upper quadrant ultrasound.  Negative for any acute process other than some fatty infiltration of the liver.  I ordered a liter fluid bolus, Dilaudid, Zofran.  I reevaluated patient who is painful improved.  Repeat abdominal exam is benign.  I considered repeating CT scan but given benign abdominal exam, the fact she had one done 3 days ago and reassuring laboratory workup I do not really feel  its indicated.  Engage in shared decision-making with the patient who is in agreement with this plan.  I suspect this is likely a gastritis, will start on pepcid and add bentyl for pain. Abdomen is soft nontender, I do not think this is a bowel obstruction, perforated ulcer (especially absence of any rectal bleeding and stable vitals), SBO given no risk factors or pancreatitis, she is not pregnant so not a ruptured ectopic.  I will send her information to set up care with a primary provider for further follow-up.  Will send home with Bentyl, Pepcid.  Encouraged to continue taking the Zofran.  Return precaution discussed with patient who verbalized understanding and agreement with the plan.          Final Clinical Impression(s) / ED Diagnoses Final diagnoses:  Epigastric pain    Rx / DC Orders ED Discharge Orders          Ordered    famotidine (PEPCID) 20 MG tablet  2 times daily        05/10/22 2012    dicyclomine (BENTYL) 20 MG tablet  2 times daily        05/10/22 2012              Theron Arista, Cordelia Poche 05/10/22 2234    Gwyneth Sprout, MD 05/10/22 2336

## 2022-05-10 NOTE — ED Triage Notes (Signed)
Pt POV with husband-pt requests husband to interpret.   Pt c/o sharp cramping epigastric pain and nausea, recently seen for same, pain unresolved. Small amount of diarrhea. Taking tylenol, no relief.   Denies emesis.

## 2022-05-10 NOTE — Discharge Instructions (Addendum)
Workup today was reassuring.  The ultrasound does not show any signs of gallbladder.  The CT scan from a few days ago send enteritis may suspect she still having some irritation of the stomach lining.  Take the Bentyl twice daily for the next few days.  Take Pepcid in the morning and in the evening.  Both these medicine should help with pain.  Continue taking the Tylenol.  Pick up the Zofran and take it every 8 hours as needed for nausea and vomiting.  Call to schedule Follow-up with a primary care provider.  If the pain becomes constant, you are unable to eat or drink you need to return back to the emergency department for evaluation.

## 2022-06-14 ENCOUNTER — Encounter: Payer: Self-pay | Admitting: Nurse Practitioner

## 2022-06-14 ENCOUNTER — Ambulatory Visit: Payer: Self-pay | Attending: Nurse Practitioner | Admitting: Nurse Practitioner

## 2022-06-14 VITALS — BP 106/73 | HR 85 | Ht 66.0 in | Wt 165.6 lb

## 2022-06-14 DIAGNOSIS — Z7689 Persons encountering health services in other specified circumstances: Secondary | ICD-10-CM

## 2022-06-14 DIAGNOSIS — D72829 Elevated white blood cell count, unspecified: Secondary | ICD-10-CM

## 2022-06-14 DIAGNOSIS — Z23 Encounter for immunization: Secondary | ICD-10-CM

## 2022-06-14 DIAGNOSIS — K219 Gastro-esophageal reflux disease without esophagitis: Secondary | ICD-10-CM

## 2022-06-14 MED ORDER — PANTOPRAZOLE SODIUM 40 MG PO TBEC: 40.0000 mg | DELAYED_RELEASE_TABLET | Freq: Every day | ORAL | 1 refills | Status: DC

## 2022-06-14 NOTE — Progress Notes (Signed)
Assessment & Plan:  Destiny Jacobs was seen today for establish care.  Diagnoses and all orders for this visit:  Encounter to establish care  GERD without esophagitis -     pantoprazole (PROTONIX) 40 MG tablet; Take 1 tablet (40 mg total) by mouth daily. INSTRUCTIONS: Avoid GERD Triggers: acidic, spicy or fried foods, caffeine, coffee, sodas,  alcohol and chocolate.    Leukocytosis, unspecified type -     CBC with Differential  Need for immunization against influenza -     Flu Vaccine QUAD 8mo+IM (Fluarix, Fluzone & Alfiuria Quad PF)    Patient has been counseled on age-appropriate routine health concerns for screening and prevention. These are reviewed and up-to-date. Referrals have been placed accordingly. Immunizations are up-to-date or declined.    Subjective:   Chief Complaint  Patient presents with   Establish Care   HPI Destiny Jacobs 42 y.o. female presents to office today to establish care She is accompanied by her spouse who is translating for her today.   She denies any past medical history such as hypertension, thyroid disorder, diabetes or hyperlipidemia  Patient has been counseled on age-appropriate routine health concerns for screening and prevention. These are reviewed and up-to-date. Referrals have been placed accordingly. Immunizations are up-to-date or declined.    MAMMOGRAM: UTD  PAP SMEAR: OVERDUE.  Will schedule for next office visit     Blood pressure is well-controlled. BP Readings from Last 3 Encounters:  06/14/22 106/73  05/10/22 102/66  05/07/22 101/68     GERD She was evaluated twice in the emergency room on December 12th and  December 15th with nausea vomiting and epigastric pain.  She was prescribed dicyclomine and famotidine however today reports she never started either 1 of these medications.  She has been taking her sister's pantoprazole and notes GI symptoms have currently resolved.    Review of Systems  Constitutional:   Negative for fever, malaise/fatigue and weight loss.  HENT: Negative.  Negative for nosebleeds.   Eyes: Negative.  Negative for blurred vision, double vision and photophobia.  Respiratory: Negative.  Negative for cough and shortness of breath.   Cardiovascular: Negative.  Negative for chest pain, palpitations and leg swelling.  Gastrointestinal:  Positive for heartburn. Negative for abdominal pain, blood in stool, constipation, diarrhea, melena, nausea and vomiting.  Musculoskeletal: Negative.  Negative for myalgias.  Neurological: Negative.  Negative for dizziness, focal weakness, seizures and headaches.  Psychiatric/Behavioral: Negative.  Negative for suicidal ideas.     Past Medical History:  Diagnosis Date   Medical history non-contributory     Past Surgical History:  Procedure Laterality Date   NO PAST SURGERIES      Family History  Problem Relation Age of Onset   Diabetes Father    Breast cancer Neg Hx     Social History Reviewed with no changes to be made today.   Outpatient Medications Prior to Visit  Medication Sig Dispense Refill   dicyclomine (BENTYL) 20 MG tablet Take 1 tablet (20 mg total) by mouth 2 (two) times daily. (Patient not taking: Reported on 06/14/2022) 20 tablet 0   famotidine (PEPCID) 20 MG tablet Take 1 tablet (20 mg total) by mouth 2 (two) times daily. (Patient not taking: Reported on 06/14/2022) 30 tablet 0   omeprazole (PRILOSEC) 20 MG capsule Take 1 capsule (20 mg total) by mouth daily. (Patient not taking: Reported on 06/14/2022) 30 capsule 0   ondansetron (ZOFRAN) 4 MG tablet Take 1 tablet (4 mg total) by  mouth every 6 (six) hours. (Patient not taking: Reported on 06/14/2022) 12 tablet 0   No facility-administered medications prior to visit.    No Known Allergies     Objective:    BP 106/73   Pulse 85   Ht 5\' 6"  (1.676 m)   Wt 165 lb 9.6 oz (75.1 kg)   LMP 05/19/2022   SpO2 98%   BMI 26.73 kg/m  Wt Readings from Last 3 Encounters:   06/14/22 165 lb 9.6 oz (75.1 kg)  05/10/22 158 lb (71.7 kg)  09/07/20 150 lb (68 kg)    Physical Exam Vitals and nursing note reviewed.  Constitutional:      Appearance: She is well-developed.  HENT:     Head: Normocephalic and atraumatic.  Cardiovascular:     Rate and Rhythm: Normal rate and regular rhythm.     Heart sounds: Normal heart sounds. No murmur heard.    No friction rub. No gallop.  Pulmonary:     Effort: Pulmonary effort is normal. No tachypnea or respiratory distress.     Breath sounds: Normal breath sounds. No decreased breath sounds, wheezing, rhonchi or rales.  Chest:     Chest wall: No tenderness.  Abdominal:     General: Bowel sounds are normal.     Palpations: Abdomen is soft.  Musculoskeletal:        General: Normal range of motion.     Cervical back: Normal range of motion.  Skin:    General: Skin is warm and dry.  Neurological:     Mental Status: She is alert and oriented to person, place, and time.     Coordination: Coordination normal.  Psychiatric:        Behavior: Behavior normal. Behavior is cooperative.        Thought Content: Thought content normal.        Judgment: Judgment normal.          Patient has been counseled extensively about nutrition and exercise as well as the importance of adherence with medications and regular follow-up. The patient was given clear instructions to go to ER or return to medical center if symptoms don't improve, worsen or new problems develop. The patient verbalized understanding.   Follow-up: Return for pap smear in April .   Gildardo Pounds, FNP-BC [facility] [place], [place]   06/14/2022, 12:43 PM

## 2022-06-14 NOTE — Progress Notes (Signed)
Spouse , [place]

## 2022-06-15 LAB — CBC WITH DIFFERENTIAL/PLATELET
Basophils Absolute: 0.1 10*3/uL (ref 0.0–0.2)
Basos: 1 %
EOS (ABSOLUTE): 0.1 10*3/uL (ref 0.0–0.4)
Eos: 1 %
Hematocrit: 41.9 % (ref 34.0–46.6)
Hemoglobin: 13.7 g/dL (ref 11.1–15.9)
Immature Grans (Abs): 0 10*3/uL (ref 0.0–0.1)
Immature Granulocytes: 0 %
Lymphocytes Absolute: 2.6 10*3/uL (ref 0.7–3.1)
Lymphs: 31 %
MCH: 29.2 pg (ref 26.6–33.0)
MCHC: 32.7 g/dL (ref 31.5–35.7)
MCV: 89 fL (ref 79–97)
Monocytes Absolute: 0.5 10*3/uL (ref 0.1–0.9)
Monocytes: 6 %
Neutrophils Absolute: 5.2 10*3/uL (ref 1.4–7.0)
Neutrophils: 61 %
Platelets: 297 10*3/uL (ref 150–450)
RBC: 4.69 x10E6/uL (ref 3.77–5.28)
RDW: 15.3 % (ref 11.7–15.4)
WBC: 8.4 10*3/uL (ref 3.4–10.8)

## 2022-06-17 ENCOUNTER — Inpatient Hospital Stay: Payer: No Typology Code available for payment source | Admitting: Nurse Practitioner

## 2022-08-29 ENCOUNTER — Ambulatory Visit: Payer: Self-pay | Admitting: Nurse Practitioner

## 2022-12-31 ENCOUNTER — Other Ambulatory Visit: Payer: Self-pay | Admitting: Obstetrics & Gynecology

## 2022-12-31 DIAGNOSIS — Z1231 Encounter for screening mammogram for malignant neoplasm of breast: Secondary | ICD-10-CM

## 2023-01-23 ENCOUNTER — Ambulatory Visit
Admission: RE | Admit: 2023-01-23 | Discharge: 2023-01-23 | Disposition: A | Discharge status: Home / Self Care | Payer: No Typology Code available for payment source | Source: Ambulatory Visit | Attending: Obstetrics & Gynecology | Admitting: Obstetrics & Gynecology

## 2023-01-23 DIAGNOSIS — Z1231 Encounter for screening mammogram for malignant neoplasm of breast: Secondary | ICD-10-CM

## 2023-12-18 ENCOUNTER — Other Ambulatory Visit: Payer: Self-pay | Admitting: Obstetrics & Gynecology

## 2023-12-18 DIAGNOSIS — Z1231 Encounter for screening mammogram for malignant neoplasm of breast: Secondary | ICD-10-CM

## 2024-02-26 ENCOUNTER — Ambulatory Visit
Admission: RE | Admit: 2024-02-26 | Discharge: 2024-02-26 | Disposition: A | Source: Ambulatory Visit | Attending: Obstetrics & Gynecology | Admitting: Obstetrics & Gynecology

## 2024-02-26 DIAGNOSIS — Z1231 Encounter for screening mammogram for malignant neoplasm of breast: Secondary | ICD-10-CM

## 2024-05-17 ENCOUNTER — Encounter (HOSPITAL_BASED_OUTPATIENT_CLINIC_OR_DEPARTMENT_OTHER): Payer: Self-pay | Admitting: Emergency Medicine

## 2024-05-17 ENCOUNTER — Other Ambulatory Visit: Payer: Self-pay

## 2024-05-17 DIAGNOSIS — R1013 Epigastric pain: Secondary | ICD-10-CM | POA: Insufficient documentation

## 2024-05-17 DIAGNOSIS — D72829 Elevated white blood cell count, unspecified: Secondary | ICD-10-CM | POA: Insufficient documentation

## 2024-05-17 DIAGNOSIS — R739 Hyperglycemia, unspecified: Secondary | ICD-10-CM | POA: Insufficient documentation

## 2024-05-17 LAB — COMPREHENSIVE METABOLIC PANEL WITH GFR
ALT: 52 U/L — ABNORMAL HIGH (ref 0–44)
AST: 36 U/L (ref 15–41)
Albumin: 4 g/dL (ref 3.5–5.0)
Alkaline Phosphatase: 83 U/L (ref 38–126)
Anion gap: 10 (ref 5–15)
BUN: 7 mg/dL (ref 6–20)
CO2: 26 mmol/L (ref 22–32)
Calcium: 9 mg/dL (ref 8.9–10.3)
Chloride: 102 mmol/L (ref 98–111)
Creatinine, Ser: 0.59 mg/dL (ref 0.44–1.00)
GFR, Estimated: >60 mL/min
Glucose, Bld: 121 mg/dL — ABNORMAL HIGH (ref 70–99)
Potassium: 3.5 mmol/L (ref 3.5–5.1)
Sodium: 138 mmol/L (ref 135–145)
Total Bilirubin: 0.6 mg/dL (ref 0.0–1.2)
Total Protein: 6.8 g/dL (ref 6.5–8.1)

## 2024-05-17 LAB — CBC
HCT: 41.6 % (ref 36.0–46.0)
Hemoglobin: 13.9 g/dL (ref 12.0–15.0)
MCH: 29.7 pg (ref 26.0–34.0)
MCHC: 33.4 g/dL (ref 30.0–36.0)
MCV: 88.9 fL (ref 80.0–100.0)
Platelets: 285 K/uL (ref 150–400)
RBC: 4.68 MIL/uL (ref 3.87–5.11)
RDW: 12.3 % (ref 11.5–15.5)
WBC: 11.2 K/uL — ABNORMAL HIGH (ref 4.0–10.5)
nRBC: 0 % (ref 0.0–0.2)

## 2024-05-17 LAB — URINALYSIS, MICROSCOPIC (REFLEX)

## 2024-05-17 LAB — URINALYSIS, ROUTINE W REFLEX MICROSCOPIC
Bilirubin Urine: NEGATIVE
Glucose, UA: NEGATIVE mg/dL
Ketones, ur: NEGATIVE mg/dL
Nitrite: NEGATIVE
Protein, ur: NEGATIVE mg/dL
Specific Gravity, Urine: 1.015 (ref 1.005–1.030)
pH: 7 (ref 5.0–8.0)

## 2024-05-17 LAB — LIPASE, BLOOD: Lipase: 33 U/L (ref 11–51)

## 2024-05-17 LAB — HCG, SERUM, QUALITATIVE: Preg, Serum: NEGATIVE

## 2024-05-17 MED ORDER — ONDANSETRON 4 MG PO TBDP
4.0000 mg | ORAL_TABLET | Freq: Once | ORAL | Status: AC
  Administered 2024-05-17: 4 mg via ORAL
  Filled 2024-05-17: qty 1

## 2024-05-17 NOTE — ED Triage Notes (Signed)
 Pt reports sharp abd pain in epigastric area. Also reports n/v. Reports it feels like heart burn & has taken pantoprazole  which  has helped some.

## 2024-05-18 ENCOUNTER — Emergency Department (HOSPITAL_BASED_OUTPATIENT_CLINIC_OR_DEPARTMENT_OTHER)
Admission: RE | Admit: 2024-05-18 | Discharge: 2024-05-18 | Disposition: A | Discharge status: Home / Self Care | Source: Ambulatory Visit | Attending: Emergency Medicine | Admitting: Emergency Medicine

## 2024-05-18 ENCOUNTER — Other Ambulatory Visit (HOSPITAL_BASED_OUTPATIENT_CLINIC_OR_DEPARTMENT_OTHER): Payer: Self-pay

## 2024-05-18 ENCOUNTER — Emergency Department (HOSPITAL_BASED_OUTPATIENT_CLINIC_OR_DEPARTMENT_OTHER)
Admission: EM | Admit: 2024-05-18 | Discharge: 2024-05-18 | Disposition: A | Discharge status: Home / Self Care | Attending: Emergency Medicine | Admitting: Emergency Medicine

## 2024-05-18 DIAGNOSIS — K219 Gastro-esophageal reflux disease without esophagitis: Secondary | ICD-10-CM

## 2024-05-18 DIAGNOSIS — R1013 Epigastric pain: Secondary | ICD-10-CM

## 2024-05-18 DIAGNOSIS — R739 Hyperglycemia, unspecified: Secondary | ICD-10-CM

## 2024-05-18 MED ORDER — PANTOPRAZOLE SODIUM 40 MG PO TBEC
40.0000 mg | DELAYED_RELEASE_TABLET | Freq: Every day | ORAL | 3 refills | Status: AC
  Filled 2024-05-18: qty 30, 30d supply, fill #0
  Filled 2024-07-01: qty 30, 30d supply, fill #1

## 2024-05-18 MED ORDER — ALUM & MAG HYDROXIDE-SIMETH 200-200-20 MG/5ML PO SUSP
30.0000 mL | Freq: Once | ORAL | Status: AC
  Administered 2024-05-18: 30 mL via ORAL
  Filled 2024-05-18: qty 30

## 2024-05-18 NOTE — Discharge Instructions (Addendum)
 Regrese por la maana para Destiny Jacobs y as astronomer que no tiene clculos biliares. Si los tiene, deber ser derivado a un cirujano.  Es importante que tome pantoprazol todos [place].  Puede tomar anticidos segn sea necesario.  Regrese al servicio de urgencias si los sntomas persisten o no se controlan adecuadamente en casa.  Return to the emergency department if symptoms or not being adequately controlled at home.

## 2024-05-18 NOTE — ED Provider Notes (Signed)
 " [place] [facility] Provider Note   CSN: [id number] Arrival date & time: 05/17/24  2239     Patient presents with: Abdominal Pain   Destiny Jacobs is a 43 y.o. female.   The history is provided by the patient. A language interpreter was used Letta [phone]).  Abdominal Pain  For the last 2 weeks she has been having epigastric pain without radiation.  There has been nausea and vomiting.  Pain is worse after eating spicy food.  She had eaten some fried rice tonight which seem to make the pain significantly worse.  She denies radiation to the back or chest.  She has been taking some of her husbands pantoprazole  which does give some slight improvement.    Prior to Admission medications  Medication Sig Start Date End Date Taking? Authorizing Provider  pantoprazole  (PROTONIX ) 40 MG tablet Take 1 tablet (40 mg total) by mouth daily. 06/14/22   Fleming, Zelda W, NP    Allergies: Patient has no known allergies.    Review of Systems  Gastrointestinal:  Positive for abdominal pain.  All other systems reviewed and are negative.   Updated Vital Signs BP (!) 145/97 (BP Location: Right Arm)   Pulse 99   Temp 98.2 F (36.8 C)   Resp 16   LMP 04/01/2024 (Approximate)   SpO2 100%   Breastfeeding No   Physical Exam Vitals and nursing note reviewed.   43 year old female, resting comfortably and in no acute distress. Vital signs are significant for elevated blood pressure. Oxygen saturation is 100%, which is normal. Head is normocephalic and atraumatic. PERRLA, EOMI.  Lungs are clear without rales, wheezes, or rhonchi. Chest is nontender. Heart has regular rate and rhythm without murmur. Abdomen is soft, flat, with mild to moderate epigastric tenderness.  There is no right upper quadrant tenderness.  There is no rebound or guarding. Skin is warm and dry without rash. Neurologic: Mental status is normal, cranial nerves are intact, moves  all extremities equally.  (all labs ordered are listed, but only abnormal results are displayed) Labs Reviewed  COMPREHENSIVE METABOLIC PANEL WITH GFR - Abnormal; Notable for the following components:      Result Value   Glucose, Bld 121 (*)    ALT 52 (*)    All other components within normal limits  CBC - Abnormal; Notable for the following components:   WBC 11.2 (*)    All other components within normal limits  URINALYSIS, ROUTINE W REFLEX MICROSCOPIC - Abnormal; Notable for the following components:   Hgb urine dipstick TRACE (*)    Leukocytes,Ua TRACE (*)    All other components within normal limits  URINALYSIS, MICROSCOPIC (REFLEX) - Abnormal; Notable for the following components:   Bacteria, UA RARE (*)    All other components within normal limits  LIPASE, BLOOD  HCG, SERUM, QUALITATIVE    EKG: None  Radiology: No results found.   Procedures   Medications Ordered in the ED  ondansetron  (ZOFRAN -ODT) disintegrating tablet 4 mg (4 mg Oral Given 05/17/24 2253)  alum & mag hydroxide-simeth (MAALOX/MYLANTA) 200-200-20 MG/5ML suspension 30 mL (30 mLs Oral Given 05/18/24 0259)                                    Medical Decision Making Amount and/or Complexity of Data Reviewed Labs: ordered. Radiology: ordered.  Risk OTC drugs. Prescription drug management.  Epigastric pain which is coming on predominantly postprandial.  Differential diagnosis includes, but is not limited to, gastritis, peptic ulcer disease, GERD, biliary colic, pancreatitis.  Doubt diverticulitis, bowel obstruction.  I have reviewed her laboratory tests, and my interpretation is she is not pregnant, normal urinalysis, mild leukocytosis which is nonspecific, normal lipase, elevated random glucose level which will need to be followed as an outpatient, mildly elevated ALT which is probably not clinically significant.  I have reviewed her past records, and noted ED visit on 05/10/2022 for epigastric pain  at which time right upper quadrant ultrasound showed fatty infiltration of the liver and no gallstones.  She had received a dose of pantoprazole  earlier tonight.  I have ordered a dose of an antiacid.  I feel she would benefit from daily pantoprazole .  I am ordering a right upper quadrant ultrasound which she will have to come back for.  I am also referring her to gastroenterology for follow-up.  She had good relief of symptoms with antacid.  This is strongly suggestive of either ulcer or GERD or gastritis.  I am discharging her with her prescription for pantoprazole , also referring her to gastroenterology for follow-up.     Final diagnoses:  Epigastric pain  Elevated random blood glucose level    ED Discharge Orders          Ordered    US  Abdomen Limited RUQ/Gall Bladder        05/18/24 0317    pantoprazole  (PROTONIX ) 40 MG tablet  Daily        05/18/24 0317               Raford Lenis, MD 05/18/24 [phone]  "

## 2024-05-18 NOTE — ED Notes (Signed)
 Spoke with radiology and ultrasound scheduled for 1500 today (05/18/24). Notified patient and instructed on where to present for appointment.

## 2024-05-18 NOTE — ED Notes (Signed)

## 2024-07-02 ENCOUNTER — Other Ambulatory Visit (HOSPITAL_BASED_OUTPATIENT_CLINIC_OR_DEPARTMENT_OTHER): Payer: Self-pay
